# Patient Record
Sex: Male | Born: 1975 | Race: Black or African American | Hispanic: No | Marital: Single | Smoking: Former smoker
Health system: Southern US, Community
[De-identification: ages and names within clinical notes are randomized; demographics above are authoritative.]

## PROBLEM LIST (undated history)

## (undated) DIAGNOSIS — F17200 Nicotine dependence, unspecified, uncomplicated: Secondary | ICD-10-CM

## (undated) DIAGNOSIS — H5440 Blindness, one eye, unspecified eye: Secondary | ICD-10-CM

## (undated) DIAGNOSIS — H409 Unspecified glaucoma: Secondary | ICD-10-CM

## (undated) DIAGNOSIS — R7303 Prediabetes: Secondary | ICD-10-CM

## (undated) DIAGNOSIS — E785 Hyperlipidemia, unspecified: Secondary | ICD-10-CM

## (undated) DIAGNOSIS — E119 Type 2 diabetes mellitus without complications: Secondary | ICD-10-CM

## (undated) HISTORY — PX: SHOULDER ARTHROSCOPY WITH ROTATOR CUFF REPAIR: SHX5685

## (undated) HISTORY — DX: Blindness, one eye, unspecified eye: H54.40

## (undated) HISTORY — DX: Unspecified glaucoma: H40.9

## (undated) HISTORY — DX: Prediabetes: R73.03

## (undated) HISTORY — DX: Hyperlipidemia, unspecified: E78.5

## (undated) HISTORY — PX: EYE SURGERY: SHX253

## (undated) HISTORY — DX: Nicotine dependence, unspecified, uncomplicated: F17.200

---

## 2016-08-07 ENCOUNTER — Emergency Department (HOSPITAL_COMMUNITY)
Admission: EM | Admit: 2016-08-07 | Discharge: 2016-08-07 | Disposition: A | Discharge status: Home / Self Care | Payer: Self-pay | Attending: Emergency Medicine | Admitting: Emergency Medicine

## 2016-08-07 ENCOUNTER — Encounter (HOSPITAL_COMMUNITY): Payer: Self-pay

## 2016-08-07 DIAGNOSIS — F17200 Nicotine dependence, unspecified, uncomplicated: Secondary | ICD-10-CM | POA: Insufficient documentation

## 2016-08-07 DIAGNOSIS — Z794 Long term (current) use of insulin: Secondary | ICD-10-CM | POA: Insufficient documentation

## 2016-08-07 DIAGNOSIS — E1165 Type 2 diabetes mellitus with hyperglycemia: Secondary | ICD-10-CM | POA: Insufficient documentation

## 2016-08-07 DIAGNOSIS — E119 Type 2 diabetes mellitus without complications: Secondary | ICD-10-CM

## 2016-08-07 DIAGNOSIS — R739 Hyperglycemia, unspecified: Secondary | ICD-10-CM

## 2016-08-07 HISTORY — DX: Type 2 diabetes mellitus without complications: E11.9

## 2016-08-07 LAB — URINE MICROSCOPIC-ADD ON

## 2016-08-07 LAB — URINALYSIS, ROUTINE W REFLEX MICROSCOPIC
Bilirubin Urine: NEGATIVE
Glucose, UA: >1000 mg/dL — AB
Hgb urine dipstick: NEGATIVE
Ketones, ur: NEGATIVE mg/dL
LEUKOCYTES UA: NEGATIVE
NITRITE: NEGATIVE
PH: 6 (ref 5.0–8.0)
Protein, ur: NEGATIVE mg/dL
SPECIFIC GRAVITY, URINE: 1.041 — AB (ref 1.005–1.030)

## 2016-08-07 LAB — CBG MONITORING, ED
GLUCOSE-CAPILLARY: 209 mg/dL — AB (ref 65–99)
Glucose-Capillary: 314 mg/dL — ABNORMAL HIGH (ref 65–99)

## 2016-08-07 LAB — COMPREHENSIVE METABOLIC PANEL
ALT: 30 U/L (ref 17–63)
AST: 21 U/L (ref 15–41)
Albumin: 3.5 g/dL (ref 3.5–5.0)
Alkaline Phosphatase: 87 U/L (ref 38–126)
Anion gap: 6 (ref 5–15)
BUN: 9 mg/dL (ref 6–20)
CHLORIDE: 108 mmol/L (ref 101–111)
CO2: 23 mmol/L (ref 22–32)
Calcium: 9.1 mg/dL (ref 8.9–10.3)
Creatinine, Ser: 0.79 mg/dL (ref 0.61–1.24)
Glucose, Bld: 313 mg/dL — ABNORMAL HIGH (ref 65–99)
POTASSIUM: 4.4 mmol/L (ref 3.5–5.1)
SODIUM: 137 mmol/L (ref 135–145)
Total Bilirubin: 0.3 mg/dL (ref 0.3–1.2)
Total Protein: 6.1 g/dL — ABNORMAL LOW (ref 6.5–8.1)

## 2016-08-07 LAB — CBC WITH DIFFERENTIAL/PLATELET
BASOS PCT: 0 %
Basophils Absolute: 0 10*3/uL (ref 0.0–0.1)
EOS ABS: 0.1 10*3/uL (ref 0.0–0.7)
EOS PCT: 2 %
HCT: 38.1 % — ABNORMAL LOW (ref 39.0–52.0)
Hemoglobin: 11.8 g/dL — ABNORMAL LOW (ref 13.0–17.0)
LYMPHS ABS: 2 10*3/uL (ref 0.7–4.0)
Lymphocytes Relative: 33 %
MCH: 22.7 pg — AB (ref 26.0–34.0)
MCHC: 31 g/dL (ref 30.0–36.0)
MCV: 73.4 fL — AB (ref 78.0–100.0)
MONO ABS: 0.5 10*3/uL (ref 0.1–1.0)
Monocytes Relative: 8 %
NEUTROS PCT: 57 %
Neutro Abs: 3.4 10*3/uL (ref 1.7–7.7)
PLATELETS: 251 10*3/uL (ref 150–400)
RBC: 5.19 MIL/uL (ref 4.22–5.81)
RDW: 12.3 % (ref 11.5–15.5)
WBC: 6 10*3/uL (ref 4.0–10.5)

## 2016-08-07 MED ORDER — METFORMIN HCL 500 MG PO TABS: 500.0000 mg | ORAL_TABLET | Freq: Two times a day (BID) | ORAL | 0 refills | Status: DC

## 2016-08-07 MED ORDER — SODIUM CHLORIDE 0.9 % IV BOLUS (SEPSIS)
1000.0000 mL | Freq: Once | INTRAVENOUS | Status: AC
  Administered 2016-08-07: 1000 mL via INTRAVENOUS

## 2016-08-07 NOTE — Discharge Instructions (Signed)
You have been set up with a follow-up appointment with a new primary care doctor on 08/17/1929. Please do not miss his appointment.  You're started on metformin [facility]. Your new primary care doctor will further titrate your medications or start back on insulin once you're able to afford it.  Please return without fail for worsening symptoms, including confusion, intractable vomiting, severe abdominal pain or chest pain, or any other symptoms concerning to you.

## 2016-08-07 NOTE — ED Notes (Signed)
Pt waiting in waiting room for [facility].

## 2016-08-07 NOTE — ED Notes (Signed)
Pt is aware urine has been needed. Urinal still at bedside. Pt is asleep and has not given sample yet.

## 2016-08-07 NOTE — ED Triage Notes (Signed)
Pt has felt weak over the last several weeks and has last 50lbs in the last few months.

## 2016-08-07 NOTE — Progress Notes (Signed)
Discussed medication with EDP, Joni FearsLui- Plans to offer metformin - listed on Most $4 pharmacy cost lists No need for MATCH at this time

## 2016-08-07 NOTE — ED Provider Notes (Signed)
[facility] Provider Note   CSN: [id number] Arrival date & time: 08/07/16  [id number]     History   Chief Complaint Chief Complaint  Patient presents with  . Weakness    HPI Devon Pace is a 40 y.o. male.  HPI 40 year old male who presents with generalized weakness. He reports being diagnosed with type 2 diabetes a year or 2 ago, and was previously on insulin. States that he did not have insurance and was unable to afford the medication, so has not been taking any of his medications over the past 6 months. During this time has had a 50 pound weight loss with reported polydipsia, polyuria, polyphasia. Associating nausea. Had cold-like symptoms 2 weeks ago, but now resolved. Denies any chest pain, difficulty breathing, abdominal pain, nausea or vomiting, diarrhea, or fever. Past Medical History:  Diagnosis Date  . Diabetes mellitus without complication (HCC)     There are no active problems to display for this patient.   History reviewed. No pertinent surgical history.     Home Medications    Prior to Admission medications   Medication Sig Start Date End Date Taking? Authorizing Provider  metFORMIN (GLUCOPHAGE) 500 MG tablet Take 1 tablet (500 mg total) by mouth 2 (two) times daily with a meal. 08/07/16   Lavera Guiseana Duo Aryel Edelen, MD    Family History No family history on file. Reviewed, noncontributory Social History Social History  Substance Use Topics  . Smoking status: Current Every Day Smoker    Packs/day: 0.50  . Smokeless tobacco: Never Used  . Alcohol use No     Allergies   Patient has no known allergies.   Review of Systems Review of Systems 10/14 systems reviewed and are negative other than those stated in the HPI   Physical Exam Updated Vital Signs BP 123/80   Pulse 63   Resp 13   Ht 5\' 7"  (1.702 m)   SpO2 100%   Physical Exam Physical Exam  Nursing note and vitals reviewed. Constitutional: Well developed, well nourished, non-toxic, and in no  acute distress Head: Normocephalic and atraumatic.  Mouth/Throat: Oropharynx is clear and moist.  Neck: Normal range of motion. Neck supple.  Cardiovascular: Normal rate and regular rhythm.   Pulmonary/Chest: Effort normal and breath sounds normal.  Abdominal: Soft. There is no tenderness. There is no rebound and no guarding.  Musculoskeletal: Normal range of motion.  Neurological: Alert, no facial droop, fluent speech, moves all extremities symmetrically Skin: Skin is warm and dry.  Psychiatric: Cooperative   ED Treatments / Results  Labs (all labs ordered are listed, but only abnormal results are displayed) Labs Reviewed  CBC WITH DIFFERENTIAL/PLATELET - Abnormal; Notable for the following:       Result Value   Hemoglobin 11.8 (*)    HCT 38.1 (*)    MCV 73.4 (*)    MCH 22.7 (*)    All other components within normal limits  COMPREHENSIVE METABOLIC PANEL - Abnormal; Notable for the following:    Glucose, Bld 313 (*)    Total Protein 6.1 (*)    All other components within normal limits  URINALYSIS, ROUTINE W REFLEX MICROSCOPIC (NOT AT [facility]) - Abnormal; Notable for the following:    Specific Gravity, Urine 1.041 (*)    Glucose, UA >1000 (*)    All other components within normal limits  URINE MICROSCOPIC-ADD ON - Abnormal; Notable for the following:    Squamous Epithelial / LPF 0-5 (*)    Bacteria, UA RARE (*)  All other components within normal limits  CBG MONITORING, ED - Abnormal; Notable for the following:    Glucose-Capillary 314 (*)    All other components within normal limits  CBG MONITORING, ED - Abnormal; Notable for the following:    Glucose-Capillary 209 (*)    All other components within normal limits    EKG  EKG Interpretation  Date/Time:  Friday August 07 2016 07:03:10 EST Ventricular Rate:  69 PR Interval:    QRS Duration: 93 QT Interval:  413 QTC Calculation: 443 R Axis:   55 Text Interpretation:  Sinus rhythm no prior ekg  Confirmed by Merrianne Mccumbers MD,  Sherryann Frese ([id number]) on 08/07/2016 8:27:33 AM       Radiology No results found.  Procedures Procedures (including critical care time)  Medications Ordered in ED Medications  sodium chloride 0.9 % bolus 1,000 mL (0 mLs Intravenous Stopped 08/07/16 0950)     Initial Impression / Assessment and Plan / ED Course  I have reviewed the triage vital signs and the nursing notes.  Pertinent labs & imaging results that were available during my care of the patient were reviewed by me and considered in my medical decision making (see chart for details).  Clinical Course    Presenting with hyperglycemia in the setting of not having insulin in over 6 months. Nontoxic in no acute distress with stable vital signs. Point-of-care glucose in the 300s, without evidence of DKA, infection, or other complications. He received IV fluids.  Spoke with Selena BattenKim from case management. Set up with new PCP appointment 08/17/2016 to establish care and get restarted on medication. At this time, someone will contact patient to start application process for orange card to afford insulin. Since this takes a few weeks to go through, will discharge him with metformin (on $4 list at [facility]) until he is able to afford insulin.   Strict return and follow-up instructions reviewed. He expressed understanding of all discharge instructions and felt comfortable with the plan of care.   Final Clinical Impressions(s) / ED Diagnoses   Final diagnoses:  Hyperglycemia  Type 2 diabetes mellitus without complication, with long-term current use of insulin (HCC)    New Prescriptions New Prescriptions   METFORMIN (GLUCOPHAGE) 500 MG TABLET    Take 1 tablet (500 mg total) by mouth 2 (two) times daily with a meal.     Lavera Guiseana Duo Traxton Kolenda, MD 08/07/16 1042

## 2016-08-07 NOTE — ED Notes (Signed)
Pt. Verbalized understanding of need to follow up with resources provided by case management. NAD. Ambulatory at discharge. Driven home by significant other.

## 2016-08-07 NOTE — Progress Notes (Signed)
[facility] to provide an update on appt, resources Faxed resources to RN to give to pt  [place] given a f/u appt with L Bernhardt   CM provided written information to assist pt with determining choice for uninsured accepting pcps, discussed the importance of pcp vs EDP services for f/u care, www.needymeds.org, www.goodrx.com, discounted pharmacies and other [place] resources such as [facility] , [facility], affordable care act, financial assistance, uninsured dental services, [place] med assist, DSS and  health department  Reviewed resources for [place] uninsured accepting pcps like [facility], family medicine at [address], [facility], [facility], local urgent care centers, [facility], [facility], general medical clinics, [facility] [place], [facility] urgent care plus others, medication resources, [facility] out patient pharmacies and housing Provided [facility] contact information Cm completed referral to assist with orange card Pt to be contact by [facility] clinical liaison

## 2016-08-07 NOTE — Progress Notes (Signed)
   08/07/16 0000  CM Assessment  Expected Discharge Plan Home/Self Care  In-house Referral NA  Discharge Planning Services CM Consult  [facility] Choice NA  Choice offered to / list presented to  Patient  Status of Service Completed, signed off  Discharge Disposition Home/Self Care   ED CM consulted by Dr Joni FearsLui to request assist with uninsured pcp and medications for pt  He is on insulin and unable to afford ED MD to re evaluate medication route for cost  CM discussed processing of orange card may take awhile and suggest med change for cost reasons [facility] to get [facility] an upcoming [facility]t  Entered in d/c instructions Concepcion LivingBERNHARDT, LINDA, NP  Family Medicine [phone] [facility] [address] [place] [id number]   Next Steps: Go on 08/17/2016  Instructions: You Have been given an appointment to see Bonita QuinLinda bernhardt for follow up medical visit [facility] at 0930 Please go to appointment or call if questions or concerns with this appointment   A referral for you has been sent to [facility] if you have not received a call in 3 days you may contact them Call Scherry RanKaren Andrianos at [phone] Tuesday-Friday [email or web address]     You can also get assist at your upcoming appointment with Concepcion LivingLinda Bernhardt on 08/17/16 at 0930 Please ask staff during the appointment   Next Steps: Follow up on 08/17/2016

## 2016-08-09 NOTE — ED Notes (Signed)
[facility] came to [facility] today.  Needs work note to return to work 08-10-16.

## 2016-08-17 ENCOUNTER — Ambulatory Visit: Payer: Self-pay | Admitting: Family Medicine

## 2016-08-18 ENCOUNTER — Ambulatory Visit (INDEPENDENT_AMBULATORY_CARE_PROVIDER_SITE_OTHER): Payer: Self-pay | Admitting: Family Medicine

## 2016-08-18 ENCOUNTER — Encounter: Payer: Self-pay | Admitting: Family Medicine

## 2016-08-18 VITALS — BP 128/70 | HR 77 | Temp 98.4°F | Resp 16 | Ht 67.0 in | Wt 151.0 lb

## 2016-08-18 DIAGNOSIS — Z23 Encounter for immunization: Secondary | ICD-10-CM

## 2016-08-18 DIAGNOSIS — E119 Type 2 diabetes mellitus without complications: Secondary | ICD-10-CM | POA: Insufficient documentation

## 2016-08-18 DIAGNOSIS — E1139 Type 2 diabetes mellitus with other diabetic ophthalmic complication: Secondary | ICD-10-CM | POA: Insufficient documentation

## 2016-08-18 DIAGNOSIS — R5383 Other fatigue: Secondary | ICD-10-CM

## 2016-08-18 LAB — CBC WITH DIFFERENTIAL/PLATELET
BASOS ABS: 0 {cells}/uL (ref 0–200)
Basophils Relative: 0 %
EOS ABS: 132 {cells}/uL (ref 15–500)
EOS PCT: 2 %
HCT: 39.1 % (ref 38.5–50.0)
HEMOGLOBIN: 11.4 g/dL — AB (ref 13.2–17.1)
LYMPHS ABS: 2772 {cells}/uL (ref 850–3900)
Lymphocytes Relative: 42 %
MCH: 22.4 pg — AB (ref 27.0–33.0)
MCHC: 29.2 g/dL — AB (ref 32.0–36.0)
MCV: 76.7 fL — AB (ref 80.0–100.0)
MONOS PCT: 7 %
MPV: 10.4 fL (ref 7.5–12.5)
Monocytes Absolute: 462 cells/uL (ref 200–950)
NEUTROS PCT: 49 %
Neutro Abs: 3234 cells/uL (ref 1500–7800)
Platelets: 339 10*3/uL (ref 140–400)
RBC: 5.1 MIL/uL (ref 4.20–5.80)
RDW: 13.2 % (ref 11.0–15.0)
WBC: 6.6 10*3/uL (ref 3.8–10.8)

## 2016-08-18 LAB — POCT URINALYSIS DIP (DEVICE)
BILIRUBIN URINE: NEGATIVE
GLUCOSE, UA: NEGATIVE mg/dL
Hgb urine dipstick: NEGATIVE
KETONES UR: NEGATIVE mg/dL
LEUKOCYTES UA: NEGATIVE
NITRITE: NEGATIVE
PH: 6 (ref 5.0–8.0)
Protein, ur: NEGATIVE mg/dL
Specific Gravity, Urine: 1.02 (ref 1.005–1.030)
Urobilinogen, UA: 0.2 mg/dL (ref 0.0–1.0)

## 2016-08-18 LAB — TSH: TSH: 0.31 m[IU]/L — AB (ref 0.40–4.50)

## 2016-08-18 LAB — POCT GLYCOSYLATED HEMOGLOBIN (HGB A1C): HEMOGLOBIN A1C: 8.5

## 2016-08-18 MED ORDER — SITAGLIPTIN PHOSPHATE 50 MG PO TABS: 50.0000 mg | ORAL_TABLET | Freq: Every day | ORAL | 2 refills | Status: DC

## 2016-08-18 NOTE — Progress Notes (Signed)
Subjective:    Patient ID: Devon Pace, male    DOB: October 22, 1975, 40 y.o.   MRN: [id number]  HPI Devon Pace, a 40 year old male with a history of diabetes mellitus type 2 presents to establish care. Devon Pace has not had a primary provider. He was evaluated in the emergency department on 08/07/2016 and prescribed Metformin for type 2 diabetes mellitus. Symptoms: polydipsia. Symptoms have gradually improved. Patient denies foot ulcerations, paresthesia of the feet, polyuria, visual disturbances and vomitting. He reports a 30 pound weight loss over the past 6 months.  Evaluation to date has been included: fasting blood sugar.  Devon Pace does not check blood sugars at home. Past Medical History:  Diagnosis Date  . Diabetes mellitus without complication [facility](HCC)    Social History   Social History  . Marital status: Single    Spouse name: N/A  . Number of children: N/A  . Years of education: N/A   Occupational History  . Not on file.   Social History Main Topics  . Smoking status: Current Every Day Smoker    Packs/day: 0.25  . Smokeless tobacco: Never Used  . Alcohol use Yes     Comment: occ  . Drug use: No  . Sexual activity: Not on file   Other Topics Concern  . Not on file   Social History Narrative  . No narrative on file  No Known Allergies  Immunization History  Administered Date(s) Administered  . Pneumococcal Polysaccharide-23 08/18/2016   Review of Systems  Constitutional: Positive for fatigue.  HENT: Negative.   Eyes: Positive for visual disturbance.  Respiratory: Negative.   Cardiovascular: Negative.  Negative for chest pain, palpitations and leg swelling.  Gastrointestinal: Negative.   Endocrine: Positive for polydipsia. Negative for polyphagia and polyuria.  Genitourinary: Negative.   Musculoskeletal: Negative.   Skin: Negative.   Allergic/Immunologic: Negative.   Neurological: Negative.  Negative for weakness.  Hematological: Negative.    Psychiatric/Behavioral: Negative.        Objective:   Physical Exam  Constitutional: He is oriented to person, place, and time. He appears well-developed and well-nourished.  HENT:  Head: Normocephalic and atraumatic.  Right Ear: External ear normal.  Left Ear: External ear normal.  Nose: Nose normal.  Mouth/Throat: Oropharynx is clear and moist.  Eyes: Conjunctivae and EOM are normal. Pupils are equal, round, and reactive to light.  Neck: Normal range of motion. Neck supple.  Pulmonary/Chest: Effort normal and breath sounds normal.  Abdominal: Soft. Bowel sounds are normal.  Musculoskeletal: Normal range of motion.  Neurological: He is alert and oriented to person, place, and time. He has normal reflexes.  Monofilament negative  Skin: Skin is warm and dry.  Psychiatric: He has a normal mood and affect. His behavior is normal. Judgment and thought content normal.      BP 128/70 (BP Location: Right Arm, Patient Position: Sitting, Cuff Size: Normal)   Pulse 77   Temp 98.4 F (36.9 C) (Oral)   Resp 16   Ht 5\' 7"  (1.702 m)   Wt 151 lb (68.5 kg)   SpO2 100%   BMI 23.65 kg/m  Assessment & Plan:  1. Type 2 diabetes mellitus without complication, without long-term current use of insulin (HCC) Hemoglobin a1C is above goal at 8.5. Will add Januvia to current medication regimen. Will also send a referral for a diabetic eye exam. The patient is asked to make an attempt to improve diet and exercise patterns to aid in  medical management of this problem.Recommend a lowfat, low carbohydrate diet divided over 5-6 small meals, increase water intake to 6-8 glasses, and 150 minutes per week of cardiovascular exercise.   - Lipid Panel - HgB A1c - sitaGLIPtin (JANUVIA) 50 MG tablet; Take 1 tablet (50 mg total) by mouth daily.  Dispense: 30 tablet; Refill: 2 - Ambulatory referral to Ophthalmology  2. Fatigue, unspecified type  - TSH - COMPLETE METABOLIC PANEL WITH GFR - CBC with  Differential  3. Immunization due  - Pneumococcal polysaccharide vaccine 23-valent greater than or equal to 2yo subcutaneous/IM   RTC: Please follow up in 1 month for medication management  Massie MaroonHollis,Yandriel Boening M, FNP

## 2016-08-19 ENCOUNTER — Telehealth: Payer: Self-pay

## 2016-08-19 LAB — COMPLETE METABOLIC PANEL WITH GFR
ALBUMIN: 4.2 g/dL (ref 3.6–5.1)
ALK PHOS: 75 U/L (ref 40–115)
ALT: 17 U/L (ref 9–46)
AST: 15 U/L (ref 10–40)
BILIRUBIN TOTAL: 0.4 mg/dL (ref 0.2–1.2)
BUN: 12 mg/dL (ref 7–25)
CO2: 26 mmol/L (ref 20–31)
Calcium: 9.4 mg/dL (ref 8.6–10.3)
Chloride: 106 mmol/L (ref 98–110)
Creat: 0.93 mg/dL (ref 0.60–1.35)
GFR, Est African American: >89 mL/min (ref >=60)
GFR, Est Non African American: >89 mL/min (ref >=60)
GLUCOSE: 97 mg/dL (ref 65–99)
Potassium: 4.7 mmol/L (ref 3.5–5.3)
SODIUM: 139 mmol/L (ref 135–146)
TOTAL PROTEIN: 6.9 g/dL (ref 6.1–8.1)

## 2016-08-19 LAB — LIPID PANEL
CHOLESTEROL: 130 mg/dL (ref <200)
HDL: 39 mg/dL — ABNORMAL LOW (ref >40)
LDL Cholesterol: 53 mg/dL (ref <100)
Total CHOL/HDL Ratio: 3.3 Ratio (ref <5.0)
Triglycerides: 188 mg/dL — ABNORMAL HIGH (ref <150)
VLDL: 38 mg/dL — ABNORMAL HIGH (ref <30)

## 2016-08-19 NOTE — Telephone Encounter (Signed)
Called and left message, advised of labs and the need to increase iron in diet such as green leafy veggies, beans, and organ meats. I advised that patient could take otc fish oil as directed on bottle. Advised to keep next appointment and to call back if any questions. Thanks!

## 2016-08-19 NOTE — Patient Instructions (Addendum)
Diabetes and Foot Care Diabetes may cause you to have problems because of poor blood supply (circulation) to your feet and legs. This may cause the skin on your feet to become thinner, break easier, and heal more slowly. Your skin may become dry, and the skin may peel and crack. You may also have nerve damage in your legs and feet causing decreased feeling in them. You may not notice minor injuries to your feet that could lead to infections or more serious problems. Taking care of your feet is one of the most important things you can do for yourself. Follow these instructions at home:  Wear shoes at all times, even in the house. Do not go barefoot. Bare feet are easily injured.  Check your feet daily for blisters, cuts, and redness. If you cannot see the bottom of your feet, use a mirror or ask someone for help.  Wash your feet with warm water (do not use hot water) and mild soap. Then pat your feet and the areas between your toes until they are completely dry. Do not soak your feet as this can dry your skin.  Apply a moisturizing lotion or petroleum jelly (that does not contain alcohol and is unscented) to the skin on your feet and to dry, brittle toenails. Do not apply lotion between your toes.  Trim your toenails straight across. Do not dig under them or around the cuticle. File the edges of your nails with an emery board or nail file.  Do not cut corns or calluses or try to remove them with medicine.  Wear clean socks or stockings every day. Make sure they are not too tight. Do not wear knee-high stockings since they may decrease blood flow to your legs.  Wear shoes that fit properly and have enough cushioning. To break in new shoes, wear them for just a few hours a day. This prevents you from injuring your feet. Always look in your shoes before you put them on to be sure there are no objects inside.  Do not cross your legs. This may decrease the blood flow to your feet.  If you find a  minor scrape, cut, or break in the skin on your feet, keep it and the skin around it clean and dry. These areas may be cleansed with mild soap and water. Do not cleanse the area with peroxide, alcohol, or iodine.  When you remove an adhesive bandage, be sure not to damage the skin around it.  If you have a wound, look at it several times a day to make sure it is healing.  Do not use heating pads or hot water bottles. They may burn your skin. If you have lost feeling in your feet or legs, you may not know it is happening until it is too late.  Make sure your health care provider performs a complete foot exam at least annually or more often if you have foot problems. Report any cuts, sores, or bruises to your health care provider immediately. Contact a health care provider if:  You have an injury that is not healing.  You have cuts or breaks in the skin.  You have an ingrown nail.  You notice redness on your legs or feet.  You feel burning or tingling in your legs or feet.  You have pain or cramps in your legs and feet.  Your legs or feet are numb.  Your feet always feel cold. Get help right away if:  There is increasing   redness, swelling, or pain in or around a wound.  There is a red line that goes up your leg.  Pus is coming from a wound.  You develop a fever or as directed by your health care provider.  You notice a bad smell coming from an ulcer or wound. This information is not intended to replace advice given to you by your health care provider. Make sure you discuss any questions you have with your health care provider. Document Released: 08/21/2000 Document Revised: 01/30/2016 Document Reviewed: 01/31/2013 Elsevier Interactive Patient Education  2017 Elsevier Inc.  Diabetes Mellitus and Exercise Exercising regularly is important for your overall health, especially when you have diabetes (diabetes mellitus). Exercising is not only about losing weight. It has many  health benefits, such as increasing muscle strength and bone density and reducing body fat and stress. This leads to improved fitness, flexibility, and endurance, all of which result in better overall health. Exercise has additional benefits for people with diabetes, including:  Reducing appetite.  Helping to lower and control blood glucose.  Lowering blood pressure.  Helping to control amounts of fatty substances (lipids) in the blood, such as cholesterol and triglycerides.  Helping the body to respond better to insulin (improving insulin sensitivity).  Reducing how much insulin the body needs.  Decreasing the risk for heart disease by:  Lowering cholesterol and triglyceride levels.  Increasing the levels of good cholesterol.  Lowering blood glucose levels. What is my activity plan? Your health care provider or certified diabetes educator can help you make a plan for the type and frequency of exercise (activity plan) that works for you. Make sure that you:  Do at least 150 minutes of moderate-intensity or vigorous-intensity exercise each week. This could be brisk walking, biking, or water aerobics.  Do stretching and strength exercises, such as yoga or weightlifting, at least 2 times a week.  Spread out your activity over at least 3 days of the week.  Get some form of physical activity every day.  Do not go more than 2 days in a row without some kind of physical activity.  Avoid being inactive for more than 90 minutes at a time. Take frequent breaks to walk or stretch.  Choose a type of exercise or activity that you enjoy, and set realistic goals.  Start slowly, and gradually increase the intensity of your exercise over time. What do I need to know about managing my diabetes?  Check your blood glucose before and after exercising.  If your blood glucose is higher than 240 mg/dL (13.3 mmol/L) before you exercise, check your urine for ketones. If you have ketones in your  urine, do not exercise until your blood glucose returns to normal.  Know the symptoms of low blood glucose (hypoglycemia) and how to treat it. Your risk for hypoglycemia increases during and after exercise. Common symptoms of hypoglycemia can include:  Hunger.  Anxiety.  Sweating and feeling clammy.  Confusion.  Dizziness or feeling light-headed.  Increased heart rate or palpitations.  Blurry vision.  Tingling or numbness around the mouth, lips, or tongue.  Tremors or shakes.  Irritability.  Keep a rapid-acting carbohydrate snack available before, during, and after exercise to help prevent or treat hypoglycemia.  Avoid injecting insulin into areas of the body that are going to be exercised. For example, avoid injecting insulin into:  The arms, when playing tennis.  The legs, when jogging.  Keep records of your exercise habits. Doing this can help you and your   health care provider adjust your diabetes management plan as needed. Write down:  Food that you eat before and after you exercise.  Blood glucose levels before and after you exercise.  The type and amount of exercise you have done.  When your insulin is expected to peak, if you use insulin. Avoid exercising at times when your insulin is peaking.  When you start a new exercise or activity, work with your health care provider to make sure the activity is safe for you, and to adjust your insulin, medicines, or food intake as needed.  Drink plenty of water while you exercise to prevent dehydration or heat stroke. Drink enough fluid to keep your urine clear or pale yellow. This information is not intended to replace advice given to you by your health care provider. Make sure you discuss any questions you have with your health care provider. Document Released: 11/14/2003 Document Revised: 03/13/2016 Document Reviewed: 02/03/2016 Elsevier Interactive Patient Education  2017 Elsevier Inc.  

## 2016-08-19 NOTE — Telephone Encounter (Signed)
-----   Message from Massie MaroonLachina M Hollis, [place] sent at 08/19/2016 10:20 AM EST ----- Regarding: lab results Please inform patient that triglycerides are mildly elevated and hemoglobin is mildly decreased. Recommend a diet that is high in iron (beans, organ meats, green leafy veggies), and a low fat diet to assist in lowering triglycerides. Recommend over the counter omega 3 fatty acid tablet (fish oil).  Thanks ----- Message ----- From: Loney HeringLaura E Batten, LPN Sent: 16/10/960412/08/2016   2:18 PM To: Massie MaroonLachina M Hollis, FNP

## 2016-09-18 ENCOUNTER — Encounter (INDEPENDENT_AMBULATORY_CARE_PROVIDER_SITE_OTHER): Payer: Managed Care, Other (non HMO)

## 2016-09-18 DIAGNOSIS — E119 Type 2 diabetes mellitus without complications: Secondary | ICD-10-CM

## 2016-09-18 LAB — GLUCOSE, CAPILLARY: GLUCOSE-CAPILLARY: 109 mg/dL — AB (ref 65–99)

## 2016-11-11 ENCOUNTER — Encounter (HOSPITAL_COMMUNITY): Payer: Self-pay | Admitting: Emergency Medicine

## 2016-11-11 ENCOUNTER — Emergency Department (HOSPITAL_COMMUNITY)
Admission: EM | Admit: 2016-11-11 | Discharge: 2016-11-11 | Disposition: A | Discharge status: Home / Self Care | Payer: Managed Care, Other (non HMO) | Attending: Emergency Medicine | Admitting: Emergency Medicine

## 2016-11-11 DIAGNOSIS — F17200 Nicotine dependence, unspecified, uncomplicated: Secondary | ICD-10-CM | POA: Diagnosis not present

## 2016-11-11 DIAGNOSIS — J029 Acute pharyngitis, unspecified: Secondary | ICD-10-CM | POA: Diagnosis present

## 2016-11-11 DIAGNOSIS — E119 Type 2 diabetes mellitus without complications: Secondary | ICD-10-CM | POA: Diagnosis not present

## 2016-11-11 DIAGNOSIS — J069 Acute upper respiratory infection, unspecified: Secondary | ICD-10-CM

## 2016-11-11 DIAGNOSIS — R69 Illness, unspecified: Secondary | ICD-10-CM

## 2016-11-11 DIAGNOSIS — J111 Influenza due to unidentified influenza virus with other respiratory manifestations: Secondary | ICD-10-CM | POA: Insufficient documentation

## 2016-11-11 DIAGNOSIS — Z7984 Long term (current) use of oral hypoglycemic drugs: Secondary | ICD-10-CM | POA: Diagnosis not present

## 2016-11-11 DIAGNOSIS — B9789 Other viral agents as the cause of diseases classified elsewhere: Secondary | ICD-10-CM

## 2016-11-11 LAB — CBG MONITORING, ED: Glucose-Capillary: 89 mg/dL (ref 65–99)

## 2016-11-11 MED ORDER — IBUPROFEN 800 MG PO TABS: 800.0000 mg | ORAL_TABLET | Freq: Four times a day (QID) | ORAL | 0 refills | Status: AC | PRN

## 2016-11-11 MED ORDER — ACETAMINOPHEN 500 MG PO TABS: 500.0000 mg | ORAL_TABLET | Freq: Four times a day (QID) | ORAL | 0 refills | Status: DC | PRN

## 2016-11-11 MED ORDER — ACETAMINOPHEN 500 MG PO TABS
1000.0000 mg | ORAL_TABLET | Freq: Once | ORAL | Status: AC
  Administered 2016-11-11: 1000 mg via ORAL
  Filled 2016-11-11: qty 2

## 2016-11-11 NOTE — ED Provider Notes (Signed)
[facility]-EMERGENCY DEPT Provider Note   CSN: [id number] Arrival date & time: 11/11/16  1441  By signing my name below, I, Freida Busmaniana Omoyeni, attest that this documentation has been prepared under the direction and in the presence of Lyndal Pulleyaniel Joell Buerger, MD . Electronically Signed: Freida Busmaniana Omoyeni, Scribe. 11/11/2016. 3:31 PM.  History   Chief Complaint Chief Complaint  Patient presents with  . URI    The history is provided by the patient. No language interpreter was used.    HPI Comments:  Devon Pace is a 41 y.o. male with a history of DM, who presents to the Emergency Department complaining of a sore throat x 2 days. He reports associated rhinorrhea, body aches, HA, and cough. He also notes an episode of diarrhea. He has been taking dayquil with minimal relief. He denies vomiting and fever. Pt is a current smoker.   Past Medical History:  Diagnosis Date  . Diabetes mellitus without complication [facility](HCC)     Patient Active Problem List   Diagnosis Date Noted  . Type 2 diabetes mellitus (HCC) 08/18/2016    No past surgical history on file.     Home Medications    Prior to Admission medications   Medication Sig Start Date End Date Taking? Authorizing Provider  metFORMIN (GLUCOPHAGE) 500 MG tablet Take 1 tablet (500 mg total) by mouth 2 (two) times daily with a meal. 08/07/16   Lavera Guiseana Duo Liu, MD  sitaGLIPtin (JANUVIA) 50 MG tablet Take 1 tablet (50 mg total) by mouth daily. 08/18/16   Massie MaroonLachina M Hollis, FNP    Family History Family History  Problem Relation Age of Onset  . Diabetes Mother   . Diabetes Maternal Grandmother     Social History Social History  Substance Use Topics  . Smoking status: Current Every Day Smoker    Packs/day: 0.25  . Smokeless tobacco: Never Used  . Alcohol use Yes     Comment: occ     Allergies   Patient has no known allergies.   Review of Systems Review of Systems  Constitutional: Negative for fever.  HENT: Positive for rhinorrhea and sore throat.    Respiratory: Positive for cough. Negative for shortness of breath.   Gastrointestinal: Positive for diarrhea. Negative for vomiting.  Musculoskeletal: Positive for myalgias.  Neurological: Positive for headaches.  All other systems reviewed and are negative.   Physical Exam Updated Vital Signs BP 136/77 (BP Location: Right Arm)   Pulse 70   Temp 98.7 F (37.1 C) (Oral)   Resp 16   Ht 6' (1.829 m)   Wt 151 lb (68.5 kg)   SpO2 100%   BMI 20.48 kg/m   Physical Exam  Constitutional: He is oriented to person, place, and time. He appears well-developed and well-nourished. No distress.  HENT:  Head: Normocephalic and atraumatic.  Nose: Nose normal.  Eyes: Conjunctivae are normal.  Neck: Neck supple. No tracheal deviation present.  Cardiovascular: Normal rate, regular rhythm and normal heart sounds.   Pulmonary/Chest: Effort normal and breath sounds normal. No respiratory distress.  Abdominal: Soft. He exhibits no distension. There is no tenderness.  Neurological: He is alert and oriented to person, place, and time.  Skin: Skin is warm and dry.  Psychiatric: He has a normal mood and affect.  Nursing note and vitals reviewed.    ED Treatments / Results  DIAGNOSTIC STUDIES:  Oxygen Saturation is 100% on RA, normal by my interpretation.    COORDINATION OF CARE:  3:26 PM Discussed treatment plan with  pt at bedside and pt agreed to plan.  Labs (all labs ordered are listed, but only abnormal results are displayed) Labs Reviewed  CBG MONITORING, ED    EKG  EKG Interpretation None       Radiology No results found.  Procedures Procedures (including critical care time)  Medications Ordered in ED Medications  acetaminophen (TYLENOL) tablet 1,000 mg (1,000 mg Oral Given 11/11/16 1614)     Initial Impression / Assessment and Plan / ED Course  I have reviewed the triage vital signs and the nursing notes.  Pertinent labs & imaging results that were available during  my care of the patient were reviewed by me and considered in my medical decision making (see chart for details).     41 y.o. male presents with viral URI Sx for 2 days. No signs of respiratory distress, non-toxic appearing, CTAB, no concern for pneumonia with this clinical picture. No emergent testing indicated at this time. Pt discharged with likely viral cough which will be self limited in its course. Advised on optimal use of motrin and tylenol for fever or symptomatic control. Plan to follow up with PCP as needed and return precautions discussed for worsening or new concerning symptoms.   Final Clinical Impressions(s) / ED Diagnoses   Final diagnoses:  Viral URI with cough  Influenza-like illness    New Prescriptions New Prescriptions   ACETAMINOPHEN (TYLENOL) 500 MG TABLET    Take 1 tablet (500 mg total) by mouth every 6 (six) hours as needed for mild pain, moderate pain or fever.   IBUPROFEN (ADVIL,MOTRIN) 800 MG TABLET    Take 1 tablet (800 mg total) by mouth every 6 (six) hours as needed for fever, mild pain or moderate pain.   I personally performed the services described in this documentation, which was scribed in my presence. The recorded information has been reviewed and is accurate.      Lyndal Pulley, MD 11/11/16 [phone]

## 2016-11-11 NOTE — ED Triage Notes (Signed)
Pt reports cough, nasal congestion and sore throat x 2 days.  

## 2016-11-16 ENCOUNTER — Encounter: Payer: Self-pay | Admitting: Family Medicine

## 2016-11-16 ENCOUNTER — Other Ambulatory Visit: Payer: Self-pay | Admitting: Family Medicine

## 2016-11-16 ENCOUNTER — Ambulatory Visit (INDEPENDENT_AMBULATORY_CARE_PROVIDER_SITE_OTHER): Payer: Managed Care, Other (non HMO) | Admitting: Family Medicine

## 2016-11-16 VITALS — BP 125/75 | HR 66 | Temp 98.3°F | Resp 14 | Ht 67.0 in | Wt 159.0 lb

## 2016-11-16 DIAGNOSIS — Z23 Encounter for immunization: Secondary | ICD-10-CM | POA: Diagnosis not present

## 2016-11-16 DIAGNOSIS — E119 Type 2 diabetes mellitus without complications: Secondary | ICD-10-CM

## 2016-11-16 DIAGNOSIS — F17200 Nicotine dependence, unspecified, uncomplicated: Secondary | ICD-10-CM | POA: Diagnosis not present

## 2016-11-16 DIAGNOSIS — Z114 Encounter for screening for human immunodeficiency virus [HIV]: Secondary | ICD-10-CM | POA: Diagnosis not present

## 2016-11-16 DIAGNOSIS — R7989 Other specified abnormal findings of blood chemistry: Secondary | ICD-10-CM

## 2016-11-16 DIAGNOSIS — R946 Abnormal results of thyroid function studies: Secondary | ICD-10-CM

## 2016-11-16 LAB — TSH: TSH: 0.31 m[IU]/L — AB (ref 0.40–4.50)

## 2016-11-16 LAB — BASIC METABOLIC PANEL
BUN: 18 mg/dL (ref 7–25)
CO2: 28 mmol/L (ref 20–31)
Calcium: 9.3 mg/dL (ref 8.6–10.3)
Chloride: 105 mmol/L (ref 98–110)
Creat: 0.93 mg/dL (ref 0.60–1.35)
Glucose, Bld: 86 mg/dL (ref 65–99)
POTASSIUM: 5.1 mmol/L (ref 3.5–5.3)
SODIUM: 139 mmol/L (ref 135–146)

## 2016-11-16 LAB — POCT GLYCOSYLATED HEMOGLOBIN (HGB A1C): HEMOGLOBIN A1C: 5.6

## 2016-11-16 MED ORDER — METFORMIN HCL 500 MG PO TABS: 500.0000 mg | ORAL_TABLET | Freq: Two times a day (BID) | ORAL | 5 refills | Status: DC

## 2016-11-16 NOTE — Progress Notes (Signed)
Subjective:    Patient ID: Devon Pace, male    DOB: 03-Aug-1976, 41 y.o.   MRN: [id number]  HPI Devon Pace, a 41 year old male with a history of diabetes mellitus type 2 presents for 3 month follow up. Patient says that he has been out of antidiabetic medications for greater than 1 month. Devon Pace has not had a primary provider.  Symptoms: polydipsia. Symptoms have gradually improved. Patient denies foot ulcerations, paresthesia of the feet, polyuria, visual disturbances and vomitting.   Evaluation to date has been included: fasting blood sugar.  Devon Pace does not check blood sugars at home. Past Medical History:  Diagnosis Date  . Diabetes mellitus without complication [facility])    Social History   Social History  . Marital status: Single    Spouse name: N/A  . Number of children: N/A  . Years of education: N/A   Occupational History  . Not on file.   Social History Main Topics  . Smoking status: Current Every Day Smoker    Packs/day: 0.25  . Smokeless tobacco: Never Used  . Alcohol use Yes     Comment: occ  . Drug use: No  . Sexual activity: Not on file   Other Topics Concern  . Not on file   Social History Narrative  . No narrative on file  No Known Allergies  Immunization History  Administered Date(s) Administered  . Pneumococcal Polysaccharide-23 08/18/2016   Review of Systems  HENT: Negative.   Eyes: Negative.   Respiratory: Negative.   Cardiovascular: Negative.  Negative for chest pain, palpitations and leg swelling.  Gastrointestinal: Negative.   Endocrine: Negative.  Negative for polydipsia, polyphagia and polyuria.  Genitourinary: Negative.   Musculoskeletal: Negative.   Skin: Negative.   Allergic/Immunologic: Negative.   Neurological: Negative.  Negative for weakness.  Hematological: Negative.   Psychiatric/Behavioral: Negative.        Objective:   Physical Exam  Constitutional: He is oriented to person, place, and time. He appears  well-developed and well-nourished.  HENT:  Head: Normocephalic and atraumatic.  Right Ear: External ear normal.  Left Ear: External ear normal.  Nose: Nose normal.  Mouth/Throat: Oropharynx is clear and moist.  Eyes: Conjunctivae and EOM are normal. Pupils are equal, round, and reactive to light.  Neck: Normal range of motion. Neck supple.  Pulmonary/Chest: Effort normal and breath sounds normal.  Abdominal: Soft. Bowel sounds are normal.  Musculoskeletal: Normal range of motion.  Neurological: He is alert and oriented to person, place, and time. He has normal reflexes.  Monofilament negative  Skin: Skin is warm and dry.  Psychiatric: He has a normal mood and affect. His behavior is normal. Judgment and thought content normal.      BP 125/75 (BP Location: Left Arm, Patient Position: Sitting, Cuff Size: Normal)   Pulse 66   Temp 98.3 F (36.8 C) (Oral)   Resp 14   Ht 5\' 7"  (1.702 m)   Wt 159 lb (72.1 kg)   SpO2 100%   BMI 24.90 kg/m  Assessment & Plan:  1. Type 2 diabetes mellitus without complication, without long-term current use of insulin (HCC) Hemoglobin a1C has decreased from 8.5 to 5.6, will discontinue Januvia.  Will also send a referral for a diabetic eye exam. The patient is asked to make an attempt to improve diet and exercise patterns to aid in medical management of this problem.Recommend a lowfat, low carbohydrate diet divided over 5-6 small meals, increase water intake to  6-8 - HgB A1c - Basic Metabolic Panel - Microalbumin/Creatinine Ratio, Urine - metFORMIN (GLUCOPHAGE) 500 MG tablet; Take 1 tablet (500 mg total) by mouth 2 (two) times daily with a meal.  Dispense: 60 tablet; Refill: 5 - Ambulatory referral to Ophthalmology  2. Abnormal TSH - TSH  3. Screening for HIV (human immunodeficiency virus)  - HIV antibody (with reflex)  4. Need for Tdap vaccination  - Tdap vaccine greater than or equal to 7yo IM  5. Tobacco dependence Smoking cessation  instruction/counseling given:  counseled patient on the dangers of tobacco use, advised patient to stop smoking, and reviewed strategies to maximize success     RTC: 6 months for DMII

## 2016-11-17 LAB — POCT URINALYSIS DIP (DEVICE)
Bilirubin Urine: NEGATIVE
Glucose, UA: NEGATIVE mg/dL
Hgb urine dipstick: NEGATIVE
Ketones, ur: NEGATIVE mg/dL
Leukocytes, UA: NEGATIVE
Nitrite: NEGATIVE
PH: 7 (ref 5.0–8.0)
PROTEIN: NEGATIVE mg/dL
Specific Gravity, Urine: 1.02 (ref 1.005–1.030)
Urobilinogen, UA: 0.2 mg/dL (ref 0.0–1.0)

## 2016-11-17 LAB — MICROALBUMIN / CREATININE URINE RATIO
CREATININE, URINE: 87 mg/dL (ref 20–370)
MICROALB UR: 0.2 mg/dL
MICROALB/CREAT RATIO: 2 ug/mg{creat} (ref <30)

## 2016-11-17 LAB — T3, FREE: T3, Free: 3.1 pg/mL (ref 2.3–4.2)

## 2016-11-17 LAB — HIV ANTIBODY (ROUTINE TESTING W REFLEX): HIV: NONREACTIVE

## 2016-11-17 LAB — T4: T4 TOTAL: 6.8 ug/dL (ref 4.5–12.0)

## 2016-11-18 ENCOUNTER — Other Ambulatory Visit: Payer: Self-pay | Admitting: Family Medicine

## 2016-11-18 DIAGNOSIS — R7989 Other specified abnormal findings of blood chemistry: Secondary | ICD-10-CM

## 2016-11-18 DIAGNOSIS — E0590 Thyrotoxicosis, unspecified without thyrotoxic crisis or storm: Secondary | ICD-10-CM

## 2016-11-18 MED ORDER — METHIMAZOLE 5 MG PO TABS: 5.0000 mg | ORAL_TABLET | Freq: Every day | ORAL | 1 refills | Status: DC

## 2016-11-18 NOTE — Progress Notes (Signed)
TSH continues to be abnormal, consistent with hyperthyroidism. Thyroid hormone helps control the rate at which the body uses energy for all processes within the body. Having decreased TSH can cause symptoms such as increased heart rate, anxiety, diarrhea, changes to hair and skin etc. Will start Methimazole daily for 1 month to regulate TSH. Will recheck TSH in 1 month please scheduled a lab appointment.   Meds ordered this encounter  Medications  . methimazole (TAPAZOLE) 5 MG tablet    Sig: Take 1 tablet (5 mg total) by mouth daily.    Dispense:  30 tablet    Refill:  1    Margrett Kalb M, FNP

## 2016-11-18 NOTE — Progress Notes (Signed)
Called and spoke with patient, advised of Hyperthyroidism and to start metimazole 5mg  daily as directed. Explained to him about hyperthyroidism and the symptoms. Advised to come back in 1 month for recheck on tsh level. This appointment was scheduled for 12/25/2016 @9 :00am. Patient verbalized understanding and had no other questions at this time. Thanks!

## 2016-12-02 ENCOUNTER — Ambulatory Visit (INDEPENDENT_AMBULATORY_CARE_PROVIDER_SITE_OTHER): Payer: Managed Care, Other (non HMO) | Admitting: Family Medicine

## 2016-12-02 ENCOUNTER — Encounter: Payer: Self-pay | Admitting: Family Medicine

## 2016-12-02 VITALS — BP 130/54 | HR 64 | Temp 98.6°F | Ht 67.0 in | Wt 158.0 lb

## 2016-12-02 DIAGNOSIS — R51 Headache: Secondary | ICD-10-CM | POA: Diagnosis not present

## 2016-12-02 DIAGNOSIS — R519 Headache, unspecified: Secondary | ICD-10-CM

## 2016-12-02 DIAGNOSIS — E119 Type 2 diabetes mellitus without complications: Secondary | ICD-10-CM

## 2016-12-02 DIAGNOSIS — R42 Dizziness and giddiness: Secondary | ICD-10-CM | POA: Diagnosis not present

## 2016-12-02 DIAGNOSIS — H538 Other visual disturbances: Secondary | ICD-10-CM

## 2016-12-02 DIAGNOSIS — E0590 Thyrotoxicosis, unspecified without thyrotoxic crisis or storm: Secondary | ICD-10-CM

## 2016-12-02 LAB — GLUCOSE, CAPILLARY: GLUCOSE-CAPILLARY: 75 mg/dL (ref 65–99)

## 2016-12-02 LAB — TSH: TSH: 0.59 mIU/L (ref 0.40–4.50)

## 2016-12-02 NOTE — Progress Notes (Signed)
Subjective:    Patient ID: Devon Pace, male    DOB: March 19, 1976, 41 y.o.   MRN: [id number]  Headache   This is a new problem. The current episode started more than 1 year ago. The problem has been unchanged. The pain is located in the bilateral region. The pain radiates to the face. The pain quality is not similar to prior headaches. The quality of the pain is described as boring and shooting. The pain is at a severity of 7/10. The pain is moderate. Associated symptoms include blurred vision, dizziness and weakness. Pertinent negatives include no abdominal pain, anorexia, drainage, ear pain, eye pain, fever, muscle aches, nausea, neck pain, photophobia, scalp tenderness or swollen glands. There is no history of cancer, cluster headaches, hypertension, immunosuppression, migraine headaches, migraines in the family, obesity, pseudotumor cerebri, recent head traumas, sinus disease or TMJ.   Past Medical History:  Diagnosis Date  . Diabetes mellitus without complication [facility](HCC)    Social History   Social History  . Marital status: Single    Spouse name: N/A  . Number of children: N/A  . Years of education: N/A   Occupational History  . Not on file.   Social History Main Topics  . Smoking status: Current Every Day Smoker    Packs/day: 0.25  . Smokeless tobacco: Never Used  . Alcohol use Yes     Comment: occ  . Drug use: No  . Sexual activity: Not on file   Other Topics Concern  . Not on file   Social History Narrative  . No narrative on file   Immunization History  Administered Date(s) Administered  . Pneumococcal Polysaccharide-23 08/18/2016  . Tdap 11/16/2016   Review of Systems  Constitutional: Negative for fatigue and fever.  HENT: Negative.  Negative for ear pain.   Eyes: Positive for blurred vision. Negative for photophobia, pain and visual disturbance.  Respiratory: Negative.   Cardiovascular: Negative.   Gastrointestinal: Negative for abdominal pain, anorexia and  nausea.  Endocrine: Negative.   Genitourinary: Negative.   Musculoskeletal: Negative for neck pain.  Allergic/Immunologic: Negative.  Negative for immunocompromised state.  Neurological: Positive for dizziness, weakness and headaches.  Hematological: Negative.   Psychiatric/Behavioral: Negative.        Objective:   Physical Exam  Constitutional: He is oriented to person, place, and time. He appears well-developed and well-nourished.  HENT:  Head: Normocephalic and atraumatic.  Right Ear: External ear normal.  Left Ear: External ear normal.  Nose: Nose normal.  Mouth/Throat: Oropharynx is clear and moist.  Eyes: Conjunctivae and EOM are normal. Pupils are equal, round, and reactive to light.  Neck: Normal range of motion. Neck supple.  Cardiovascular: Normal rate, regular rhythm, normal heart sounds and intact distal pulses.   Pulmonary/Chest: Effort normal and breath sounds normal.  Abdominal: Soft. Bowel sounds are normal.  Neurological: He is alert and oriented to person, place, and time. He has normal reflexes. No cranial nerve deficit or sensory deficit. He exhibits normal muscle tone. He displays no seizure activity. Coordination and gait normal.  Skin: Skin is warm and dry.  Psychiatric: He has a normal mood and affect. His behavior is normal. Judgment and thought content normal.      BP (!) 130/54 (BP Location: Right Arm, Patient Position: Sitting, Cuff Size: Small)   Pulse 64   Temp 98.6 F (37 C) (Oral)   Ht 5\' 7"  (1.702 m)   Wt 158 lb (71.7 kg)   SpO2 100%  BMI 24.75 kg/m  Assessment & Plan:  1. Nonintractable headache, unspecified chronicity pattern, unspecified headache type - CT Head Wo Contrast; Future  2. Dizziness, nonspecific - Glucose, capillary - CT Head Wo Contrast; Future  3. Hyperthyroidism Will discontinue methiamazole. Will send a referral to endocrinology.  - TSH - Ambulatory referral to Endocrinology  4. Type 2 diabetes mellitus without  complication, without long-term current use of insulin (HCC) - Glucose (CBG)  5. Blurred vision, bilateral - Glucose, capillary - CT Head Wo Contrast; Future   Nolon Nations  MSN, FNP-C [facility] [address] [place], [place] [address] [phone]   The patient was given clear instructions to go to ER or return to medical center if symptoms do not improve, worsen or new problems develop. The patient verbalized understanding. Will notify patient with laboratory results.

## 2016-12-02 NOTE — Patient Instructions (Addendum)
Will schedule a stat Head CT at [facility] for Worsening headache and dizziness Will send a stat TSH  Will hold Methimazole Will send a referral to endocrinology for abnormal TSH Will follow up by phone with laboratory results and CT resultsDizziness Dizziness is a common problem. It makes you feel unsteady or light-headed. You may feel like you are about to pass out (faint). Dizziness can lead to getting hurt if you stumble or fall. Dizziness can be caused by many things, including:  Medicines.  Not having enough water in your body (dehydration).  Illness. Follow these instructions at home: Eating and drinking   Drink enough fluid to keep your pee (urine) clear or pale yellow. This helps to keep you from getting dehydrated. Try to drink more clear fluids, such as water.  Do not drink alcohol.  Limit how much caffeine you drink or eat, if your doctor tells you to do that.  Limit how much salt (sodium) you drink or eat, if your doctor tells you to do that. Activity   Avoid making quick movements.  When you stand up from sitting in a chair, steady yourself until you feel okay.  In the morning, first sit up on the side of the bed. When you feel okay, stand slowly while you hold onto something. Do this until you know that your balance is fine.  If you need to stand in one place for a long time, move your legs often. Tighten and relax the muscles in your legs while you are standing.  Do not drive or use heavy machinery if you feel dizzy.  Avoid bending down if you feel dizzy. Place items in your home so you can reach them easily without leaning over. Lifestyle   Do not use any products that contain nicotine or tobacco, such as cigarettes and e-cigarettes. If you need help quitting, ask your doctor.  Try to lower your stress level. You can do this by using methods such as yoga or meditation. Talk with your doctor if you need help. General instructions   Watch your dizziness for any  changes.  Take over-the-counter and prescription medicines only as told by your doctor. Talk with your doctor if you think that you are dizzy because of a medicine that you are taking.  Tell a friend or a family member that you are feeling dizzy. If he or she notices any changes in your behavior, have this person call your doctor.  Keep all follow-up visits as told by your doctor. This is important. Contact a doctor if:  Your dizziness does not go away.  Your dizziness or light-headedness gets worse.  You feel sick to your stomach (nauseous).  You have trouble hearing.  You have new symptoms.  You are unsteady on your feet.  You feel like the room is spinning. Get help right away if:  You throw up (vomit) or have watery poop (diarrhea), and you cannot eat or drink anything.  You have trouble:  Talking.  Walking.  Swallowing.  Using your arms, hands, or legs.  You feel generally weak.  You are not thinking clearly, or you have trouble forming sentences. A friend or family member may notice this.  You have:  Chest pain.  Pain in your belly (abdomen).  Shortness of breath.  Sweating.  Your vision changes.  You are bleeding.  You have a very bad headache.  You have neck pain or a stiff neck.  You have a fever. These symptoms may be an  emergency. Do not wait to see if the symptoms will go away. Get medical help right away. Call your local emergency services (911 in the [place] Do not drive yourself to the hospital. Summary  Dizziness makes you feel unsteady or light-headed. You may feel like you are about to pass out (faint).  Drink enough fluid to keep your pee (urine) clear or pale yellow. Do not drink alcohol.  Avoid making quick movements if you feel dizzy.  Watch your dizziness for any changes. This information is not intended to replace advice given to you by your health care provider. Make sure you discuss any questions you have with your health  care provider. Document Released: 08/13/2011 Document Revised: 09/10/2016 Document Reviewed: 09/10/2016 Elsevier Interactive Patient Education  2017 [employer] Blurred Vision Having blurred vision means that you cannot see things clearly. Your vision may seem fuzzy or out of focus. Blurred vision is a very common symptom of an eye or vision problem. Blurred vision is often a gradual blur that occurs in one eye or both eyes. There are many causes of blurred vision, including cataracts, macular degeneration, and diabetic retinopathy. Blurred vision can be diagnosed based on your symptoms and a physical exam. Tell your health care provider about any other health problems you have, any recent eye injury, and any prior surgeries. You may need to see a health care provider who specializes in eye problems (ophthalmologist). Your treatment depends on what is causing your blurred vision.  HOME CARE INSTRUCTIONS  Tell your health care provider about any changes in your blurred vision.  Do not drive or operate heavy machinery if your vision is blurry.  Keep all follow-up visits as directed by your health care provider. This is important. SEEK MEDICAL CARE IF:  Your symptoms get worse.  You have new symptoms.  You have trouble seeing at night.  You have trouble seeing up close or far away.  You have trouble noticing the difference between colors. SEEK IMMEDIATE MEDICAL CARE IF:  You have severe eye pain.  You have a severe headache.  You have flashing lights in your field of vision.  You have a sudden change in vision.  You have a sudden loss of vision.  You have vision change after an injury.  You notice drainage coming from your eyes.  You notice a rash around your eyes. This information is not intended to replace advice given to you by your health care provider. Make sure you discuss any questions you have with your health care provider. Document Released: 08/27/2003 Document  Revised: 01/08/2015 Document Reviewed: 07/18/2014 Elsevier Interactive Patient Education  2017 [employer]

## 2016-12-03 ENCOUNTER — Ambulatory Visit (HOSPITAL_COMMUNITY)
Admission: RE | Admit: 2016-12-03 | Discharge: 2016-12-03 | Disposition: A | Discharge status: Home / Self Care | Payer: Managed Care, Other (non HMO) | Source: Ambulatory Visit | Attending: Family Medicine | Admitting: Family Medicine

## 2016-12-03 DIAGNOSIS — H538 Other visual disturbances: Secondary | ICD-10-CM | POA: Diagnosis not present

## 2016-12-03 DIAGNOSIS — R51 Headache: Secondary | ICD-10-CM | POA: Diagnosis not present

## 2016-12-03 DIAGNOSIS — R519 Headache, unspecified: Secondary | ICD-10-CM

## 2016-12-03 DIAGNOSIS — R42 Dizziness and giddiness: Secondary | ICD-10-CM | POA: Diagnosis not present

## 2016-12-25 ENCOUNTER — Other Ambulatory Visit (INDEPENDENT_AMBULATORY_CARE_PROVIDER_SITE_OTHER): Payer: Managed Care, Other (non HMO)

## 2016-12-25 DIAGNOSIS — R946 Abnormal results of thyroid function studies: Secondary | ICD-10-CM

## 2016-12-25 DIAGNOSIS — E0590 Thyrotoxicosis, unspecified without thyrotoxic crisis or storm: Secondary | ICD-10-CM

## 2016-12-25 DIAGNOSIS — R7989 Other specified abnormal findings of blood chemistry: Secondary | ICD-10-CM

## 2016-12-26 LAB — TSH: TSH: 0.29 m[IU]/L — AB (ref 0.40–4.50)

## 2016-12-28 ENCOUNTER — Other Ambulatory Visit: Payer: Self-pay | Admitting: Family Medicine

## 2016-12-28 NOTE — Progress Notes (Signed)
[name] a  41 year old male with a history of type 2 diabetes mellitus had a TSH of 0.29. T4 is within normal limits. Patient continues to have periodic fatigue, but denies chest pains, heart palpitations, changes to hair/skin, weight loss, anxiety, or diaphoresis. Patient is not followed by endocrinology. Will continue to monitor closely.   Nolon Nations  MSN, FNP-C [facility] [address] [place], [place] [address] [phone]

## 2017-01-26 ENCOUNTER — Encounter: Payer: Self-pay | Admitting: Endocrinology

## 2017-02-11 ENCOUNTER — Emergency Department (HOSPITAL_COMMUNITY)
Admission: EM | Admit: 2017-02-11 | Discharge: 2017-02-11 | Disposition: A | Discharge status: Home / Self Care | Payer: Managed Care, Other (non HMO) | Attending: Emergency Medicine | Admitting: Emergency Medicine

## 2017-02-11 ENCOUNTER — Encounter (HOSPITAL_COMMUNITY): Payer: Self-pay | Admitting: Emergency Medicine

## 2017-02-11 DIAGNOSIS — Z7984 Long term (current) use of oral hypoglycemic drugs: Secondary | ICD-10-CM | POA: Insufficient documentation

## 2017-02-11 DIAGNOSIS — F17210 Nicotine dependence, cigarettes, uncomplicated: Secondary | ICD-10-CM | POA: Insufficient documentation

## 2017-02-11 DIAGNOSIS — G4489 Other headache syndrome: Secondary | ICD-10-CM | POA: Insufficient documentation

## 2017-02-11 DIAGNOSIS — R51 Headache: Secondary | ICD-10-CM | POA: Diagnosis present

## 2017-02-11 DIAGNOSIS — E119 Type 2 diabetes mellitus without complications: Secondary | ICD-10-CM | POA: Insufficient documentation

## 2017-02-11 DIAGNOSIS — R519 Headache, unspecified: Secondary | ICD-10-CM

## 2017-02-11 LAB — BASIC METABOLIC PANEL
ANION GAP: 5 (ref 5–15)
BUN: 13 mg/dL (ref 6–20)
CALCIUM: 8.7 mg/dL — AB (ref 8.9–10.3)
CHLORIDE: 107 mmol/L (ref 101–111)
CO2: 25 mmol/L (ref 22–32)
Creatinine, Ser: 0.78 mg/dL (ref 0.61–1.24)
GFR calc non Af Amer: >60 mL/min (ref >60)
Glucose, Bld: 113 mg/dL — ABNORMAL HIGH (ref 65–99)
POTASSIUM: 5.2 mmol/L — AB (ref 3.5–5.1)
Sodium: 137 mmol/L (ref 135–145)

## 2017-02-11 LAB — CBC WITH DIFFERENTIAL/PLATELET
BASOS PCT: 0 %
Basophils Absolute: 0 10*3/uL (ref 0.0–0.1)
Eosinophils Absolute: 0.1 10*3/uL (ref 0.0–0.7)
Eosinophils Relative: 2 %
HEMATOCRIT: 41 % (ref 39.0–52.0)
HEMOGLOBIN: 12.5 g/dL — AB (ref 13.0–17.0)
LYMPHS ABS: 2.3 10*3/uL (ref 0.7–4.0)
LYMPHS PCT: 45 %
MCH: 23 pg — ABNORMAL LOW (ref 26.0–34.0)
MCHC: 30.5 g/dL (ref 30.0–36.0)
MCV: 75.4 fL — ABNORMAL LOW (ref 78.0–100.0)
Monocytes Absolute: 0.4 10*3/uL (ref 0.1–1.0)
Monocytes Relative: 9 %
NEUTROS ABS: 2.2 10*3/uL (ref 1.7–7.7)
NEUTROS PCT: 44 %
Platelets: 247 10*3/uL (ref 150–400)
RBC: 5.44 MIL/uL (ref 4.22–5.81)
RDW: 12.5 % (ref 11.5–15.5)
WBC: 4.9 10*3/uL (ref 4.0–10.5)

## 2017-02-11 MED ORDER — LORATADINE-PSEUDOEPHEDRINE ER 10-240 MG PO TB24: 1.0000 | ORAL_TABLET | Freq: Every day | ORAL | 0 refills | Status: DC

## 2017-02-11 MED ORDER — IBUPROFEN 600 MG PO TABS: 600.0000 mg | ORAL_TABLET | Freq: Four times a day (QID) | ORAL | 0 refills | Status: DC | PRN

## 2017-02-11 NOTE — ED Provider Notes (Signed)
[facility] DEPT Provider Note   CSN: [id number] Arrival date & time: 02/11/17  0725     History   Chief Complaint Chief Complaint  Patient presents with  . Headache    HPI Devon Pace is a 41 y.o. male.  HPI Patient reports she's had a headache for several days. It's at the top of his head and forehead. Aching and pressure. It comes and goes. This is been going on for several months. Patient reports sometimes he feels he has a little bit of blurred vision. Occasional nausea no vomiting. No sore throat or dental pain. No significant nasal congestion or drainage. No other associated symptoms. No fevers no chills no neck stiffness no joint redness or swelling. Patient is diabetic. He reports he takes metformin. He reports his blood sugar was 95 today. Past Medical History:  Diagnosis Date  . Diabetes mellitus without complication [facility])     Patient Active Problem List   Diagnosis Date Noted  . Tobacco dependence 11/16/2016  . Abnormal TSH 11/16/2016  . Type 2 diabetes mellitus (HCC) 08/18/2016    History reviewed. No pertinent surgical history.     Home Medications    Prior to Admission medications   Medication Sig Start Date End Date Taking? Authorizing Provider  acetaminophen (TYLENOL) 500 MG tablet Take 1 tablet (500 mg total) by mouth every 6 (six) hours as needed for mild pain, moderate pain or fever. 11/11/16   Lyndal Pulley, MD  ibuprofen (ADVIL,MOTRIN) 600 MG tablet Take 1 tablet (600 mg total) by mouth every 6 (six) hours as needed. 02/11/17   Arby Barrette, MD  loratadine-pseudoephedrine (CLARITIN-D 24 HOUR) 10-240 MG 24 hr tablet Take 1 tablet by mouth daily. 02/11/17   Arby Barrette, MD  metFORMIN (GLUCOPHAGE) 500 MG tablet Take 1 tablet (500 mg total) by mouth 2 (two) times daily with a meal. 11/16/16   Massie Maroon, FNP  sitaGLIPtin (JANUVIA) 50 MG tablet Take 1 tablet (50 mg total) by mouth daily. Patient not taking: Reported on 12/02/2016 08/18/16    Massie Maroon, FNP    Family History Family History  Problem Relation Age of Onset  . Diabetes Mother   . Diabetes Maternal Grandmother     Social History Social History  Substance Use Topics  . Smoking status: Current Every Day Smoker    Packs/day: 0.25  . Smokeless tobacco: Never Used  . Alcohol use Yes     Comment: occ     Allergies   Patient has no known allergies.   Review of Systems Review of Systems 10 Systems reviewed and are negative for acute change except as noted in the HPI.   Physical Exam Updated Vital Signs BP 125/87   Pulse (!) 58   Temp 97.5 F (36.4 C)   Resp 16   SpO2 100%   Physical Exam  Constitutional: He is oriented to person, place, and time. He appears well-developed and well-nourished.  HENT:  Head: Normocephalic and atraumatic.  Bilateral TMs normal. Slight bogginess of nasal mucosa. No percussion tenderness over the sinuses. Dentition in good condition. No facial swelling. Posterior oropharynx widely pain.  Eyes: Conjunctivae and EOM are normal. Pupils are equal, round, and reactive to light.  Neck: Neck supple.  No meningismus.  Cardiovascular: Normal rate and regular rhythm.   No murmur heard. Pulmonary/Chest: Effort normal and breath sounds normal. No respiratory distress.  Abdominal: Soft. There is no tenderness.  Musculoskeletal: Normal range of motion. He exhibits no edema.  Lymphadenopathy:  He has no cervical adenopathy.  Neurological: He is alert and oriented to person, place, and time. No cranial nerve deficit. He exhibits normal muscle tone. Coordination normal.  Normal heel shin examination. Motor strength intact 4. Normal coordinated movements without difficulty.  Skin: Skin is warm and dry.  Psychiatric: He has a normal mood and affect.  Nursing note and vitals reviewed.    ED Treatments / Results  Labs (all labs ordered are listed, but only abnormal results are displayed) Labs Reviewed  BASIC METABOLIC  PANEL - Abnormal; Notable for the following:       Result Value   Potassium 5.2 (*)    Glucose, Bld 113 (*)    Calcium 8.7 (*)    All other components within normal limits  CBC WITH DIFFERENTIAL/PLATELET - Abnormal; Notable for the following:    Hemoglobin 12.5 (*)    MCV 75.4 (*)    MCH 23.0 (*)    All other components within normal limits    EKG  EKG Interpretation None       Radiology No results found.  Procedures Procedures (including critical care time)  Medications Ordered in ED Medications - No data to display   Initial Impression / Assessment and Plan / ED Course  I have reviewed the triage vital signs and the nursing notes.  Pertinent labs & imaging results that were available during my care of the patient were reviewed by me and considered in my medical decision making (see chart for details).     Final Clinical Impressions(s) / ED Diagnoses   Final diagnoses:  Acute nonintractable headache, unspecified headache type   Patient has had a headache has been waxing and waning. He does not have acute onset, fever, neurologic symptoms or neck stiffness. Low suspicion for emergent etiology of headaches. With quality of headache and minimal associated symptoms I more suspect seasonal allergy\sinus pressure or tension headache. Plan will be for the patient to try a course of Claritin-D with ibuprofen and follow-up with PCP. New Prescriptions New Prescriptions   IBUPROFEN (ADVIL,MOTRIN) 600 MG TABLET    Take 1 tablet (600 mg total) by mouth every 6 (six) hours as needed.   LORATADINE-PSEUDOEPHEDRINE (CLARITIN-D 24 HOUR) 10-240 MG 24 HR TABLET    Take 1 tablet by mouth daily.     Arby BarrettePfeiffer, Ali Mohl, MD 02/11/17 [phone]

## 2017-02-11 NOTE — ED Triage Notes (Signed)
Pt reports posterior headache since yesterday, also reports feeling "funny" when he moves. States he is diabetic, reports CBG at home was 95. Pt a/ox4, resp e/u, nad.

## 2017-02-11 NOTE — ED Notes (Signed)
No questions or concerns

## 2017-02-15 ENCOUNTER — Encounter: Payer: Self-pay | Admitting: Family Medicine

## 2017-02-15 ENCOUNTER — Ambulatory Visit (INDEPENDENT_AMBULATORY_CARE_PROVIDER_SITE_OTHER): Payer: Managed Care, Other (non HMO) | Admitting: Family Medicine

## 2017-02-15 VITALS — BP 130/64 | HR 58 | Temp 97.8°F | Resp 14 | Ht 67.0 in | Wt 158.0 lb

## 2017-02-15 DIAGNOSIS — E119 Type 2 diabetes mellitus without complications: Secondary | ICD-10-CM

## 2017-02-15 DIAGNOSIS — G44229 Chronic tension-type headache, not intractable: Secondary | ICD-10-CM | POA: Diagnosis not present

## 2017-02-15 DIAGNOSIS — R946 Abnormal results of thyroid function studies: Secondary | ICD-10-CM | POA: Diagnosis not present

## 2017-02-15 DIAGNOSIS — R7989 Other specified abnormal findings of blood chemistry: Secondary | ICD-10-CM

## 2017-02-15 LAB — POCT GLYCOSYLATED HEMOGLOBIN (HGB A1C): HEMOGLOBIN A1C: 5.1

## 2017-02-15 MED ORDER — BUTALBITAL-APAP-CAFFEINE 50-325-40 MG PO TABS: 1.0000 | ORAL_TABLET | Freq: Two times a day (BID) | ORAL | 0 refills | Status: DC | PRN

## 2017-02-15 NOTE — Patient Instructions (Addendum)
Abnormal TSH:  Follow up by phone with laboratory results   Chronic tension type headache, not intractable:   Fioricet Every 12 hours as needed for chronic headaches  Recommend a routine eye examination ([facility])

## 2017-02-15 NOTE — Progress Notes (Signed)
Subjective:    Patient ID: Devon Pace, male    DOB: 1976/03/05, 41 y.o.   MRN: [id number]  [name], a 41 year old patient presents for a follow up of chronic headache pain. He was evaluated in the emergency department on 02/11/2017 for a persistent headache. He says that headaches occur intermittently.    Headache   This is a chronic problem. The current episode started more than 1 year ago (Reviewed CT from 12/03/2016, which was unremarkable). The problem has been unchanged. The pain is located in the bilateral region. The pain does not radiate. The pain quality is similar to prior headaches. The quality of the pain is described as boring, squeezing and band-like. The pain is at a severity of 4/10. The pain is moderate (Typically takes Devon Pace or Ibuprofen without sustained relief. ). Pertinent negatives include no abdominal pain, anorexia, blurred vision, drainage, ear pain, eye pain, fever, muscle aches, nausea, neck pain, photophobia, scalp tenderness, swollen glands, visual change or weight loss. He has tried darkened room, acetaminophen and NSAIDs for the symptoms. The treatment provided mild relief. His past medical history is significant for sinus disease. There is no history of cancer, cluster headaches, hypertension, immunosuppression, migraine headaches, migraines in the family, obesity, pseudotumor cerebri, recent head traumas or TMJ.  Thyroid Problem  Presents for follow-up visit. Patient reports no anxiety, cold intolerance, constipation, depressed mood, diaphoresis, diarrhea, dry skin, fatigue, hair loss, heat intolerance, hoarse voice, leg swelling, palpitations, tremors, visual change or weight loss.   Past Medical History:  Diagnosis Date  . Diabetes mellitus without complication [facility])    Social History   Social History  . Marital status: Single    Spouse name: N/A  . Number of children: N/A  . Years of education: N/A   Occupational History  . Not on file.    Social History Main Topics  . Smoking status: Current Every Day Smoker    Packs/day: 0.25  . Smokeless tobacco: Never Used  . Alcohol use Yes     Comment: occ  . Drug use: No  . Sexual activity: Not on file   Other Topics Concern  . Not on file   Social History Narrative  . No narrative on file   Immunization History  Administered Date(s) Administered  . Pneumococcal Polysaccharide-23 08/18/2016  . Tdap 11/16/2016   Review of Systems  Constitutional: Negative for diaphoresis, fatigue, fever and weight loss.  HENT: Negative.  Negative for ear pain and hoarse voice.   Eyes: Negative for blurred vision, photophobia, pain and visual disturbance.  Respiratory: Negative.   Cardiovascular: Negative.  Negative for palpitations.  Gastrointestinal: Negative.  Negative for abdominal pain, anorexia, constipation, diarrhea and nausea.  Endocrine: Negative for cold intolerance and heat intolerance.  Genitourinary: Negative.   Musculoskeletal: Negative.  Negative for neck pain.  Skin: Negative.   Allergic/Immunologic: Negative.  Negative for immunocompromised state.  Neurological: Positive for headaches. Negative for tremors.  Hematological: Negative.   Psychiatric/Behavioral: Negative.  The patient is not nervous/anxious.        Objective:   Physical Exam  Constitutional: He is oriented to person, place, and time. He appears well-developed and well-nourished.  HENT:  Head: Normocephalic and atraumatic.  Right Ear: External ear normal.  Left Ear: External ear normal.  Nose: Nose normal.  Mouth/Throat: Oropharynx is clear and moist.  Eyes: Conjunctivae and EOM are normal. Pupils are equal, round, and reactive to light.  Neck: Normal range of motion. Neck supple.  Cardiovascular: Normal rate, regular rhythm, normal heart sounds and intact distal pulses.   Pulmonary/Chest: Effort normal and breath sounds normal.  Abdominal: Soft. Bowel sounds are normal.  Neurological: He is  alert and oriented to person, place, and time. He has normal reflexes. No cranial nerve deficit or sensory deficit. He exhibits normal muscle tone. He displays no seizure activity. Coordination and gait normal.  Skin: Skin is warm and dry.  Psychiatric: He has a normal mood and affect. His behavior is normal. Judgment and thought content normal.      BP 130/64 (BP Location: Left Arm, Patient Position: Sitting, Cuff Size: Normal)   Pulse (!) 58   Temp 97.8 F (36.6 C) (Oral)   Resp 14   Ht 5\' 7"  (1.702 m)   Wt 158 lb (71.7 kg)   SpO2 100%   BMI 24.75 kg/m  Assessment & Plan:  1. Chronic tension-type headache, not intractable Will start a trial of Fiorcet 50-325-40 mg twice daily as needed.  Discussed maintaining a headache diary If chronic headaches doe not improve, may warrant a referral to neurology.  - butalbital-acetaminophen-caffeine (FIORICET, ESGIC) 50-325-40 MG tablet; Take 1 tablet by mouth 2 (two) times daily as needed for headache.  Dispense: 30 tablet; Refill: 0  2. Type 2 diabetes mellitus without complication, without long-term current use of insulin (HCC) Patient has been taking medications consistently. Hemoglobin has decreased to 5.1, will discontinue Metformin.  - HgB A1c  3. Abnormal TSH - Thyroid Panel With TSH   RTC: 1 month for chronic headache   Erin SonsLaChina Rennis PettyMoore Hollis  MSN, FNP-C [facility] [address]  [place], [place] [id number] [phone]

## 2017-02-16 LAB — THYROID PANEL WITH TSH
FREE THYROXINE INDEX: 1.9 (ref 1.4–3.8)
T3 Uptake: 28 % (ref 22–35)
T4, Total: 6.7 ug/dL (ref 4.5–12.0)
TSH: 0.26 m[IU]/L — AB (ref 0.40–4.50)

## 2017-02-18 ENCOUNTER — Ambulatory Visit: Payer: Managed Care, Other (non HMO) | Admitting: Family Medicine

## 2017-03-05 ENCOUNTER — Emergency Department (HOSPITAL_COMMUNITY)
Admission: EM | Admit: 2017-03-05 | Discharge: 2017-03-05 | Disposition: A | Discharge status: Home / Self Care | Payer: Managed Care, Other (non HMO) | Attending: Emergency Medicine | Admitting: Emergency Medicine

## 2017-03-05 ENCOUNTER — Emergency Department (HOSPITAL_COMMUNITY): Payer: Managed Care, Other (non HMO)

## 2017-03-05 ENCOUNTER — Encounter (HOSPITAL_COMMUNITY): Payer: Self-pay

## 2017-03-05 DIAGNOSIS — W1830XA Fall on same level, unspecified, initial encounter: Secondary | ICD-10-CM | POA: Diagnosis not present

## 2017-03-05 DIAGNOSIS — Y929 Unspecified place or not applicable: Secondary | ICD-10-CM | POA: Insufficient documentation

## 2017-03-05 DIAGNOSIS — Z79899 Other long term (current) drug therapy: Secondary | ICD-10-CM | POA: Insufficient documentation

## 2017-03-05 DIAGNOSIS — E119 Type 2 diabetes mellitus without complications: Secondary | ICD-10-CM | POA: Insufficient documentation

## 2017-03-05 DIAGNOSIS — F17200 Nicotine dependence, unspecified, uncomplicated: Secondary | ICD-10-CM | POA: Insufficient documentation

## 2017-03-05 DIAGNOSIS — Y999 Unspecified external cause status: Secondary | ICD-10-CM | POA: Insufficient documentation

## 2017-03-05 DIAGNOSIS — S9032XA Contusion of left foot, initial encounter: Secondary | ICD-10-CM | POA: Diagnosis not present

## 2017-03-05 DIAGNOSIS — Y939 Activity, unspecified: Secondary | ICD-10-CM | POA: Diagnosis not present

## 2017-03-05 DIAGNOSIS — S99922A Unspecified injury of left foot, initial encounter: Secondary | ICD-10-CM | POA: Diagnosis present

## 2017-03-05 MED ORDER — IBUPROFEN 600 MG PO TABS: 600.0000 mg | ORAL_TABLET | Freq: Four times a day (QID) | ORAL | 0 refills | Status: DC | PRN

## 2017-03-05 NOTE — ED Triage Notes (Signed)
Pt presents with 2 day h/o L foot pain, reports he was at work yesterday and struck it on a jack.  Pt able to bear weight.

## 2017-03-05 NOTE — ED Provider Notes (Signed)
[facility] Provider Note   CSN: [id number] Arrival date & time: 03/05/17  1317  By signing my name below, I, Linna DarnerRussell Turner, attest that this documentation has been prepared under the direction and in the presence of Fayrene HelperBowie Shanyn Preisler, PA-C . Electronically Signed: Linna Darnerussell Turner, Scribe. 03/05/2017. 2:14 PM.  History   Chief Complaint Chief Complaint  Patient presents with  . Foot Pain   The history is provided by the patient. No language interpreter was used.    HPI Comments: Devon Cliffric Pace is a 41 y.o. male with PMHx of DM2 who presents to the Emergency Department for evaluation of a left foot injury sustained yesterday. He states he struck his left foot directly on a pallet jack at work. Patient reports significant pain to the lateral dorsum of his left foot as well as some swelling. He is ambulatory with difficulty secondary to pain and endorses pain exacerbation with weight bearing. Patient rates his current pain at 7.5/10 in severity and describes it as sharp. Patient has tried ibuprofen, ice, and elevation without significant relief. He notes that he is on his feet often at work. He denies left ankle pain, numbness/tingling, bruising, open wounds, or any other associated symptoms.  Past Medical History:  Diagnosis Date  . Diabetes mellitus without complication [facility](HCC)     Patient Active Problem List   Diagnosis Date Noted  . Tobacco dependence 11/16/2016  . Abnormal TSH 11/16/2016  . Type 2 diabetes mellitus (HCC) 08/18/2016    History reviewed. No pertinent surgical history.     Home Medications    Prior to Admission medications   Medication Sig Start Date End Date Taking? Authorizing Provider  butalbital-acetaminophen-caffeine (FIORICET, ESGIC) [phone] MG tablet Take 1 tablet by mouth 2 (two) times daily as needed for headache. 02/15/17   Massie MaroonHollis, Lachina M, FNP  ibuprofen (ADVIL,MOTRIN) 600 MG tablet Take 1 tablet (600 mg total) by mouth every 6 (six) hours as needed.  03/05/17   Fayrene Helperran, Kweli Grassel, PA-C  loratadine-pseudoephedrine (CLARITIN-D 24 HOUR) 10-240 MG 24 hr tablet Take 1 tablet by mouth daily. 02/11/17   Arby BarrettePfeiffer, Marcy, MD    Family History Family History  Problem Relation Age of Onset  . Diabetes Mother   . Diabetes Maternal Grandmother     Social History Social History  Substance Use Topics  . Smoking status: Current Every Day Smoker    Packs/day: 0.25  . Smokeless tobacco: Never Used  . Alcohol use Yes     Comment: occ     Allergies   Patient has no known allergies.   Review of Systems Review of Systems  Musculoskeletal: Positive for arthralgias and joint swelling.  Skin: Negative for color change and wound.  Neurological: Negative for numbness.   Physical Exam Updated Vital Signs BP 127/75 (BP Location: Left Arm)   Pulse (!) 58   Temp 97.9 F (36.6 C)   Resp 18   SpO2 100%   Physical Exam  Constitutional: He is oriented to person, place, and time. He appears well-developed and well-nourished. No distress.  HENT:  Head: Normocephalic and atraumatic.  Eyes: Conjunctivae and EOM are normal.  Neck: Neck supple. No tracheal deviation present.  Cardiovascular: Normal rate.   Pulmonary/Chest: Effort normal. No respiratory distress.  Musculoskeletal: Normal range of motion.  Left foot: Tenderness to lateral dorsum of foot on palpation with mild swelling but no crepitus or deformity. Able to move all toes. Brisk cap refill. DP pulse is palpable.  Neurological: He is alert and oriented to  person, place, and time.  Skin: Skin is warm and dry.  Psychiatric: He has a normal mood and affect. His behavior is normal.  Nursing note and vitals reviewed.  ED Treatments / Results  Labs (all labs ordered are listed, but only abnormal results are displayed) Labs Reviewed - No data to display  EKG  EKG Interpretation None       Radiology Dg Foot Complete Left  Result Date: 03/05/2017 CLINICAL DATA:  Ree Kida fell on foot, pain at  fifth metatarsal EXAM: LEFT FOOT - COMPLETE 3+ VIEW COMPARISON:  None FINDINGS: Osseous mineralization normal. Joint spaces preserved. No acute fracture, dislocation or bone destruction. IMPRESSION: No acute osseous abnormalities. Electronically Signed   By: Ulyses Southward M.D.   On: 03/05/2017 14:05    Procedures Procedures (including critical care time)  DIAGNOSTIC STUDIES: Oxygen Saturation is 100% on RA, normal by my interpretation.    COORDINATION OF CARE: 2:14 PM Discussed treatment plan with pt at bedside and pt agreed to plan.  Medications Ordered in ED Medications - No data to display   Initial Impression / Assessment and Plan / ED Course  I have reviewed the triage vital signs and the nursing notes.  Pertinent labs & imaging results that were available during my care of the patient were reviewed by me and considered in my medical decision making (see chart for details).     Patient with foot pain. X-Ray negative for obvious fracture or dislocation.  Pt advised to follow up with orthopedics. Patient given ACE wrap while in ED, conservative therapy recommended and discussed. Patient will be discharged home & is agreeable with above plan. Returns precautions discussed. Pt appears safe for discharge.  Final Clinical Impressions(s) / ED Diagnoses   Final diagnoses:  Contusion of left foot, initial encounter    New Prescriptions Current Discharge Medication List     I personally performed the services described in this documentation, which was scribed in my presence. The recorded information has been reviewed and is accurate.      Fayrene Helper, PA-C 03/05/17 1416    Long, Arlyss Repress, MD 03/05/17 2028

## 2017-03-05 NOTE — ED Notes (Signed)
Patient transported to X-ray 

## 2017-03-05 NOTE — ED Notes (Signed)
Returned from xray

## 2017-03-05 NOTE — ED Notes (Signed)
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 C/o left lateral foot pain. No deformity.

## 2017-03-29 ENCOUNTER — Encounter: Payer: Self-pay | Admitting: Family Medicine

## 2017-03-29 ENCOUNTER — Ambulatory Visit (INDEPENDENT_AMBULATORY_CARE_PROVIDER_SITE_OTHER): Payer: Managed Care, Other (non HMO) | Admitting: Family Medicine

## 2017-03-29 VITALS — BP 128/69 | HR 60 | Temp 98.2°F | Resp 16 | Ht 67.0 in | Wt 155.0 lb

## 2017-03-29 DIAGNOSIS — R946 Abnormal results of thyroid function studies: Secondary | ICD-10-CM | POA: Diagnosis not present

## 2017-03-29 DIAGNOSIS — F17200 Nicotine dependence, unspecified, uncomplicated: Secondary | ICD-10-CM | POA: Diagnosis not present

## 2017-03-29 DIAGNOSIS — R42 Dizziness and giddiness: Secondary | ICD-10-CM

## 2017-03-29 DIAGNOSIS — R7989 Other specified abnormal findings of blood chemistry: Secondary | ICD-10-CM

## 2017-03-29 DIAGNOSIS — R531 Weakness: Secondary | ICD-10-CM

## 2017-03-29 LAB — COMPLETE METABOLIC PANEL WITH GFR
ALBUMIN: 4.2 g/dL (ref 3.6–5.1)
ALK PHOS: 78 U/L (ref 40–115)
ALT: 22 U/L (ref 9–46)
AST: 17 U/L (ref 10–40)
BILIRUBIN TOTAL: 0.4 mg/dL (ref 0.2–1.2)
BUN: 12 mg/dL (ref 7–25)
CO2: 23 mmol/L (ref 20–31)
Calcium: 9.3 mg/dL (ref 8.6–10.3)
Chloride: 107 mmol/L (ref 98–110)
Creat: 0.86 mg/dL (ref 0.60–1.35)
GLUCOSE: 63 mg/dL — AB (ref 65–99)
Potassium: 4.8 mmol/L (ref 3.5–5.3)
SODIUM: 139 mmol/L (ref 135–146)
TOTAL PROTEIN: 6.7 g/dL (ref 6.1–8.1)

## 2017-03-29 LAB — CBC WITH DIFFERENTIAL/PLATELET
BASOS ABS: 0 {cells}/uL (ref 0–200)
Basophils Relative: 0 %
EOS PCT: 2 %
Eosinophils Absolute: 122 cells/uL (ref 15–500)
HCT: 44 % (ref 38.5–50.0)
Hemoglobin: 13.3 g/dL (ref 13.2–17.1)
Lymphocytes Relative: 45 %
Lymphs Abs: 2745 cells/uL (ref 850–3900)
MCH: 23.3 pg — AB (ref 27.0–33.0)
MCHC: 30.2 g/dL — AB (ref 32.0–36.0)
MCV: 76.9 fL — ABNORMAL LOW (ref 80.0–100.0)
MONOS PCT: 11 %
MPV: 9.6 fL (ref 7.5–12.5)
Monocytes Absolute: 671 cells/uL (ref 200–950)
NEUTROS ABS: 2562 {cells}/uL (ref 1500–7800)
Neutrophils Relative %: 42 %
PLATELETS: 268 10*3/uL (ref 140–400)
RBC: 5.72 MIL/uL (ref 4.20–5.80)
RDW: 13.3 % (ref 11.0–15.0)
WBC: 6.1 10*3/uL (ref 3.8–10.8)

## 2017-03-29 LAB — POCT URINALYSIS DIP (DEVICE)
Bilirubin Urine: NEGATIVE
GLUCOSE, UA: NEGATIVE mg/dL
Ketones, ur: NEGATIVE mg/dL
LEUKOCYTES UA: NEGATIVE
NITRITE: NEGATIVE
Protein, ur: NEGATIVE mg/dL
Specific Gravity, Urine: 1.015 (ref 1.005–1.030)
UROBILINOGEN UA: 0.2 mg/dL (ref 0.0–1.0)
pH: 6.5 (ref 5.0–8.0)

## 2017-03-29 LAB — TSH: TSH: 0.4 m[IU]/L (ref 0.40–4.50)

## 2017-03-29 LAB — GLUCOSE, CAPILLARY: Glucose-Capillary: 89 mg/dL (ref 65–99)

## 2017-03-29 MED ORDER — MECLIZINE HCL 12.5 MG PO TABS
12.5000 mg | ORAL_TABLET | Freq: Three times a day (TID) | ORAL | 0 refills | Status: DC | PRN
Start: 2017-03-29 — End: 2017-03-29

## 2017-03-29 MED ORDER — MECLIZINE HCL 12.5 MG PO TABS: 12.5000 mg | ORAL_TABLET | Freq: Three times a day (TID) | ORAL | 0 refills | Status: DC | PRN

## 2017-03-29 NOTE — Patient Instructions (Addendum)
Hold Metformin, will review labs as they become available.   Will start a trial of Meclizine 12.5 mg every 8 hours as needed.   Reviewed EKG, within normal limits. Will resend referral to endocrinology for Hyperthyroidism.    Meclizine tablets or capsules What is this medicine? MECLIZINE (MEK li zeen) is an antihistamine. It is used to prevent nausea, vomiting, or dizziness caused by motion sickness. It is also used to prevent and treat vertigo (extreme dizziness or a feeling that you or your surroundings are tilting or spinning around). This medicine may be used for other purposes; ask your health care provider or pharmacist if you have questions. COMMON BRAND NAME(S): Antivert, Dramamine Less Drowsy, Medivert, Meni-D What should I tell my health care provider before I take this medicine? They need to know if you have any of these conditions: -glaucoma -lung or breathing disease, like asthma -problems urinating -prostate disease -stomach or intestine problems -an unusual or allergic reaction to meclizine, other medicines, foods, dyes, or preservatives -pregnant or trying to get pregnant -breast-feeding How should I use this medicine? Take this medicine by mouth with a glass of water. Follow the directions on the prescription label. If you are using this medicine to prevent motion sickness, take the dose at least 1 hour before travel. If it upsets your stomach, take it with food or milk. Take your doses at regular intervals. Do not take your medicine more often than directed. Talk to your pediatrician regarding the use of this medicine in children. Special care may be needed. Overdosage: If you think you have taken too much of this medicine contact a poison control center or emergency room at once. NOTE: This medicine is only for you. Do not share this medicine with others. What if I miss a dose? If you miss a dose, take it as soon as you can. If it is almost time for your next dose, take  only that dose. Do not take double or extra doses. What may interact with this medicine? Do not take this medicine with any of the following medications: -MAOIs like Carbex, Eldepryl, Marplan, Nardil, and Parnate This medicine may also interact with the following medications: -alcohol -antihistamines for allergy, cough and cold -certain medicines for anxiety or sleep -certain medicines for depression, like amitriptyline, fluoxetine, sertraline -certain medicines for seizures like phenobarbital, primidone -general anesthetics like halothane, isoflurane, methoxyflurane, propofol -local anesthetics like lidocaine, pramoxine, tetracaine -medicines that relax muscles for surgery -narcotic medicines for pain -phenothiazines like chlorpromazine, mesoridazine, prochlorperazine, thioridazine This list may not describe all possible interactions. Give your health care provider a list of all the medicines, herbs, non-prescription drugs, or dietary supplements you use. Also tell them if you smoke, drink alcohol, or use illegal drugs. Some items may interact with your medicine. What should I watch for while using this medicine? Tell your doctor or healthcare professional if your symptoms do not start to get better or if they get worse. You may get drowsy or dizzy. Do not drive, use machinery, or do anything that needs mental alertness until you know how this medicine affects you. Do not stand or sit up quickly, especially if you are an older patient. This reduces the risk of dizzy or fainting spells. Alcohol may interfere with the effect of this medicine. Avoid alcoholic drinks. Your mouth may get dry. Chewing sugarless gum or sucking hard candy, and drinking plenty of water may help. Contact your doctor if the problem does not go away or is severe. This  medicine may cause dry eyes and blurred vision. If you wear contact lenses you may feel some discomfort. Lubricating drops may help. See your eye doctor if  the problem does not go away or is severe. What side effects may I notice from receiving this medicine? Side effects that you should report to your doctor or health care professional as soon as possible: -feeling faint or lightheaded, falls -fast, irregular heartbeat Side effects that usually do not require medical attention (report to your doctor or health care professional if they continue or are bothersome): -constipation -headache -trouble passing urine or change in the amount of urine -trouble sleeping -upset stomach This list may not describe all possible side effects. Call your doctor for medical advice about side effects. You may report side effects to FDA at 1-800-FDA-1088. Where should I keep my medicine? Keep out of the reach of children. Store at room temperature between 15 and 30 degrees C (59 and 86 degrees F). Keep container tightly closed. Throw away any unused medicine after the expiration date. NOTE: This sheet is a summary. It may not cover all possible information. If you have questions about this medicine, talk to your doctor, pharmacist, or health care provider.  2018 Elsevier/Gold Standard (2015-09-25 19:41:02)  Dizziness Dizziness is a common problem. It is a feeling of unsteadiness or light-headedness. You may feel like you are about to faint. Dizziness can lead to injury if you stumble or fall. Anyone can become dizzy, but dizziness is more common in older adults. This condition can be caused by a number of things, including medicines, dehydration, or illness. Follow these instructions at home: Taking these steps may help with your condition: Eating and drinking  Drink enough fluid to keep your urine clear or pale yellow. This helps to keep you from becoming dehydrated. Try to drink more clear fluids, such as water.  Do not drink alcohol.  Limit your caffeine intake if directed by your health care provider.  Limit your salt intake if directed by your health  care provider. Activity  Avoid making quick movements. ? Rise slowly from chairs and steady yourself until you feel okay. ? In the morning, first sit up on the side of the bed. When you feel okay, stand slowly while you hold onto something until you know that your balance is fine.  Move your legs often if you need to stand in one place for a long time. Tighten and relax your muscles in your legs while you are standing.  Do not drive or operate heavy machinery if you feel dizzy.  Avoid bending down if you feel dizzy. Place items in your home so that they are easy for you to reach without leaning over. Lifestyle  Do not use any tobacco products, including cigarettes, chewing tobacco, or electronic cigarettes. If you need help quitting, ask your health care provider.  Try to reduce your stress level, such as with yoga or meditation. Talk with your health care provider if you need help. General instructions  Watch your dizziness for any changes.  Take medicines only as directed by your health care provider. Talk with your health care provider if you think that your dizziness is caused by a medicine that you are taking.  Tell a friend or a family member that you are feeling dizzy. If he or she notices any changes in your behavior, have this person call your health care provider.  Keep all follow-up visits as directed by your health care provider. This is  important. Contact a health care provider if:  Your dizziness does not go away.  Your dizziness or light-headedness gets worse.  You feel nauseous.  You have reduced hearing.  You have new symptoms.  You are unsteady on your feet or you feel like the room is spinning. Get help right away if:  You vomit or have diarrhea and are unable to eat or drink anything.  You have problems talking, walking, swallowing, or using your arms, hands, or legs.  You feel generally weak.  You are not thinking clearly or you have trouble forming  sentences. It may take a friend or family member to notice this.  You have chest pain, abdominal pain, shortness of breath, or sweating.  Your vision changes.  You notice any bleeding.  You have a headache.  You have neck pain or a stiff neck.  You have a fever. This information is not intended to replace advice given to you by your health care provider. Make sure you discuss any questions you have with your health care provider. Document Released: 02/17/2001 Document Revised: 01/30/2016 Document Reviewed: 08/20/2014 Elsevier Interactive Patient Education  2017 [employer]

## 2017-03-29 NOTE — Progress Notes (Signed)
Subjective:    Patient ID: Devon Pace, male    DOB: Oct 04, 1975, 41 y.o.   MRN: [id number]  HPI  Devon Pace, a 41 year old male with a history of prediabetes presents complaining of weakness and dizziness upon awakening this am. He awakened with dizziness on standing. He says that it was difficult to get out of bed. Patient presents with dizziness .  The dizziness has been present for several hours. The patient describes the symptoms as weakness and "light headed". Mr. Hechler denies aural pressure, otalgia, tinnitus, and hearing loss. He has not used any OTC interventions to alleviate symptoms.   Past Medical History:  Diagnosis Date  . Diabetes mellitus without complication [facility])    Social History   Social History  . Marital status: Single    Spouse name: N/A  . Number of children: N/A  . Years of education: N/A   Occupational History  . Not on file.   Social History Main Topics  . Smoking status: Current Every Day Smoker    Packs/day: 0.25  . Smokeless tobacco: Never Used  . Alcohol use Yes     Comment: occ  . Drug use: No  . Sexual activity: Not on file   Other Topics Concern  . Not on file   Social History Narrative  . No narrative on file   Immunization History  Administered Date(s) Administered  . Pneumococcal Polysaccharide-23 08/18/2016  . Tdap 11/16/2016  No Known Allergies Review of Systems  HENT: Negative.   Eyes: Negative.   Respiratory: Negative.   Cardiovascular: Negative.  Negative for chest pain and palpitations.  Gastrointestinal: Negative.   Endocrine: Negative for polydipsia, polyphagia and polyuria.  Genitourinary: Negative.   Musculoskeletal: Negative.   Skin: Negative.   Allergic/Immunologic: Negative for immunocompromised state.  Neurological: Positive for dizziness and weakness.  Hematological: Negative.   Psychiatric/Behavioral: Negative.  Negative for agitation and confusion. The patient is not nervous/anxious.        Objective:    Physical Exam  Constitutional: He is oriented to person, place, and time. He appears well-developed and well-nourished.  HENT:  Head: Normocephalic and atraumatic.  Right Ear: External ear normal.  Left Ear: External ear normal.  Nose: Nose normal.  Mouth/Throat: Oropharynx is clear and moist.  Eyes: Pupils are equal, round, and reactive to light. Conjunctivae and EOM are normal.  Cardiovascular: Normal rate, regular rhythm, normal heart sounds and intact distal pulses.  Exam reveals no gallop and no friction rub.   No murmur heard. Pulmonary/Chest: Effort normal and breath sounds normal.  Abdominal: Soft. Bowel sounds are normal.  Musculoskeletal: Normal range of motion.  Neurological: He is alert and oriented to person, place, and time. He has normal strength and normal reflexes. No cranial nerve deficit. He displays a negative Romberg sign. Gait normal.  Skin: Skin is warm and dry.  Psychiatric: He has a normal mood and affect. His behavior is normal. Judgment and thought content normal.     BP 128/69 (BP Location: Left Arm, Patient Position: Sitting, Cuff Size: Normal)   Pulse 60   Temp 98.2 F (36.8 C) (Oral)   Resp 16   Ht 5\' 7"  (1.702 m)   Wt 155 lb (70.3 kg)   SpO2 100%   BMI 24.28 kg/m  Assessment & Plan:  1. Weakness Reviewed EKG; Normal sinus rhythm.  Hemoglobin a1C has decreased to 4.9. I suspect that Metformin may be causing weakness. Will discontinue and follow up by phone on 04/02/2017.  Fasting CBG is 85.  Will check TSH, was previously 0.26, a referral was sent to endocrinology Will review CBC and CMP as results become available.  - POCT CBG monitoring - Hemoglobin A1c - COMPLETE METABOLIC PANEL WITH GFR - CBC with Differential - TSH - EKG 12-Lead  2. Abnormal TSH Previous TSH was 0.26, will repeat on today due to current symptoms.   3. Dizziness Dizziness not reproducible on physical exam. Will start a trial of meclizine 12.5 mg every 8 hours as needed.   - meclizine (ANTIVERT) 12.5 MG tablet; Take 1 tablet (12.5 mg total) by mouth 3 (three) times daily as needed for dizziness.  Dispense: 30 tablet; Refill: 0  4. Tobacco dependence Patient continues to decrease smoking. Smoking cessation instruction/counseling given:  counseled patient on the dangers of tobacco use, advised patient to stop smoking, and reviewed strategies to maximize success  RTC: Will follow up by phone with any abnormal laboratory results.    Nolon NationsLaChina Moore Jazleen Robeck  MSN, FNP-C [facility] Patient [facility] [address]  [place], [place] [id number] [phone]

## 2017-03-30 ENCOUNTER — Telehealth: Payer: Self-pay | Admitting: Family Medicine

## 2017-03-30 DIAGNOSIS — Z8639 Personal history of other endocrine, nutritional and metabolic disease: Secondary | ICD-10-CM

## 2017-03-30 DIAGNOSIS — R7989 Other specified abnormal findings of blood chemistry: Secondary | ICD-10-CM

## 2017-03-30 LAB — HEMOGLOBIN A1C
Hgb A1c MFr Bld: 4.9 % (ref <5.7)
Mean Plasma Glucose: 94 mg/dL

## 2017-03-30 NOTE — Telephone Encounter (Signed)
Hemoglobin a1C is decreased at 4.9, will discontinue Metformin 500 mg twice daily. Patient will follow up for labs in 3 months.    Nolon NationsLaChina Moore Hollis  MSN, FNP-C [facility] Patient [facility] [address]  [place], [place] [address] [phone]

## 2017-05-20 ENCOUNTER — Ambulatory Visit: Payer: Managed Care, Other (non HMO) | Admitting: Family Medicine

## 2017-05-27 ENCOUNTER — Encounter: Payer: Self-pay | Admitting: Family Medicine

## 2017-05-27 ENCOUNTER — Ambulatory Visit (INDEPENDENT_AMBULATORY_CARE_PROVIDER_SITE_OTHER): Payer: Managed Care, Other (non HMO) | Admitting: Family Medicine

## 2017-05-27 VITALS — BP 131/77 | HR 62 | Temp 97.7°F | Resp 16 | Ht 67.0 in | Wt 154.0 lb

## 2017-05-27 DIAGNOSIS — R079 Chest pain, unspecified: Secondary | ICD-10-CM | POA: Diagnosis not present

## 2017-05-27 DIAGNOSIS — R030 Elevated blood-pressure reading, without diagnosis of hypertension: Secondary | ICD-10-CM | POA: Diagnosis not present

## 2017-05-27 DIAGNOSIS — R7989 Other specified abnormal findings of blood chemistry: Secondary | ICD-10-CM

## 2017-05-27 DIAGNOSIS — R946 Abnormal results of thyroid function studies: Secondary | ICD-10-CM

## 2017-05-27 DIAGNOSIS — F17200 Nicotine dependence, unspecified, uncomplicated: Secondary | ICD-10-CM

## 2017-05-27 DIAGNOSIS — Z13220 Encounter for screening for lipoid disorders: Secondary | ICD-10-CM

## 2017-05-27 NOTE — Progress Notes (Signed)
Subjective:    Devon Pace is a 41 y.o. male who presents for evaluation of elevated blood pressures. Patient was at work this am an had blood pressure check and it was 152/90. Patient felt fatigued and was told to leave work and follow up with primary provider. Blood pressure is at goal on arrival. He says that he feels better since getting rest and hydrating. Patient denies: dyspnea, fatigue, lower extremity edema, orthopnea, palpitations, syncope and tachypnea. Cardiovascular risk factors include: diabetes mellitus and smoking/ tobacco exposure. He endorses periodic chest pain.  Past Medical History:  Diagnosis Date  . Diabetes mellitus without complication [facility])    Social History   Social History  . Marital status: Single    Spouse name: N/A  . Number of children: N/A  . Years of education: N/A   Occupational History  . Not on file.   Social History Main Topics  . Smoking status: Current Every Day Smoker    Packs/day: 0.25  . Smokeless tobacco: Never Used  . Alcohol use Yes     Comment: occ  . Drug use: No  . Sexual activity: Not on file   Other Topics Concern  . Not on file   Social History Narrative  . No narrative on file   No Known Allergies Immunization History  Administered Date(s) Administered  . Pneumococcal Polysaccharide-23 08/18/2016  . Tdap 11/16/2016    Review of Systems Review of Systems  Constitutional: Negative for malaise/fatigue and weight loss.  HENT: Negative.   Eyes: Negative.   Respiratory: Negative.   Cardiovascular: Positive for chest pain. Negative for palpitations, orthopnea, claudication, leg swelling and PND.  Gastrointestinal: Negative.   Genitourinary: Negative.  Negative for dysuria and urgency.  Musculoskeletal: Negative.   Skin: Negative.   Neurological: Negative.   Endo/Heme/Allergies: Negative.   Psychiatric/Behavioral: Negative.  Negative for depression, hallucinations, memory loss, substance abuse and suicidal ideas. The  patient is not nervous/anxious and does not have insomnia.     Objective:     BP 131/77 (BP Location: Left Arm, Patient Position: Sitting, Cuff Size: Normal)   Pulse 62   Temp 97.7 F (36.5 C) (Oral)   Resp 16   Ht  (1.702 m)   Wt 154 lb (69.9 kg)   SpO2 100%   BMI 24.12 kg/m   General Appearance:    Alert, cooperative, no distress, appears stated age  Head:    Normocephalic, without obvious abnormality, atraumatic  Eyes:    PERRL, conjunctiva/corneas clear, EOM's intact, fundi    benign, both eyes       Ears:    Normal TM's and external ear canals, both ears  Nose:   Nares normal, septum midline, mucosa normal, no drainage    or sinus tenderness  Throat:   Lips, mucosa, and tongue normal; teeth and gums normal  Neck:   Supple, symmetrical, trachea midline, no adenopathy;       thyroid:  No enlargement/tenderness/nodules; no carotid   bruit or JVD  Back:     Symmetric, no curvature, ROM normal, no CVA tenderness  Lungs:     Clear to auscultation bilaterally, respirations unlabored  Chest wall:    No tenderness or deformity  Heart:    Regular rate and rhythm, S1 and S2 normal, no murmur, rub   or gallop  Abdomen:     Soft, non-tender, bowel sounds active all four quadrants,    no masses, no organomegaly  Extremities:   Extremities normal, atraumatic, no cyanosis  or edema  Pulses:   2+ and symmetric all extremities  Skin:   Skin color, texture, turgor normal, no rashes or lesions  Lymph nodes:   Cervical, supraclavicular, and axillary nodes normal  Neurologic:   CNII-XII intact. Normal strength, sensation and reflexes      throughout    Cardiographics ECG: normal sinus rhythm    Assessment:    Hypertension, prehypertension . No evidence of target organ damage.  BP 131/77 (BP Location: Left Arm, Patient Position: Sitting, Cuff Size: Normal)   Pulse 62   Temp 97.7 F (36.5 C) (Oral)   Resp 16   Ht  (1.702 m)   Wt 154 lb (69.9 kg)   SpO2 100%   BMI 24.12  kg/m  Plan:  1. Chest pain, unspecified type Reviewed EKG, borderline. Refrain from daily caffeine use Recommend smoking cessation  - EKG 12-Lead - COMPLETE METABOLIC PANEL WITH GFR - Troponin I  2. Elevated blood pressure reading Blood pressure is mildly elevated. He does not warrant medication at this time. Patient should continue to check blood pressure at work.   3. Tobacco dependence Smoking cessation instruction/counseling given:  counseled patient on the dangers of tobacco use, advised patient to stop smoking, and reviewed strategies to maximize success  4. Abnormal TSH - TSH  5. Screening cholesterol level The 10-year ASCVD risk score Denman George DC Jr., et al., 2013) is: 9.2%   Values used to calculate the score:     Age: 54 years     Sex: Male     Is Non-Hispanic African American: Yes     Diabetic: Yes     Tobacco smoker: Yes     Systolic Blood Pressure: 131 mmHg     Is BP treated: No     HDL Cholesterol: 55 mg/dL     Total Cholesterol: 150 mg/dL  - Lipid Panel  Dietary sodium restriction. Regular aerobic exercise. Check blood pressures daily and record.    The patient was given clear instructions to go to ER or return to medical center if symptoms do not improve, worsen or new problems develop. The patient verbalized understanding. Will notify patient with laboratory results.   Nolon Nations  MSN, FNP-C Patient Care [facility] [address] [place], [place] [address] [phone]

## 2017-05-28 LAB — COMPLETE METABOLIC PANEL WITH GFR
AG RATIO: 1.7 (calc) (ref 1.0–2.5)
ALKALINE PHOSPHATASE (APISO): 77 U/L (ref 40–115)
ALT: 30 U/L (ref 9–46)
AST: 22 U/L (ref 10–40)
Albumin: 4.3 g/dL (ref 3.6–5.1)
BILIRUBIN TOTAL: 0.4 mg/dL (ref 0.2–1.2)
BUN: 12 mg/dL (ref 7–25)
CHLORIDE: 109 mmol/L (ref 98–110)
CO2: 24 mmol/L (ref 20–32)
Calcium: 9.1 mg/dL (ref 8.6–10.3)
Creat: 0.79 mg/dL (ref 0.60–1.35)
GFR, EST NON AFRICAN AMERICAN: 112 mL/min/{1.73_m2} (ref >=60)
GFR, Est African American: 130 mL/min/{1.73_m2} (ref >=60)
GLOBULIN: 2.5 g/dL (ref 1.9–3.7)
Glucose, Bld: 83 mg/dL (ref 65–99)
POTASSIUM: 4.4 mmol/L (ref 3.5–5.3)
SODIUM: 140 mmol/L (ref 135–146)
Total Protein: 6.8 g/dL (ref 6.1–8.1)

## 2017-05-28 LAB — LIPID PANEL
CHOL/HDL RATIO: 2.7 (calc) (ref <5.0)
CHOLESTEROL: 150 mg/dL (ref <200)
HDL: 55 mg/dL (ref >40)
LDL CHOLESTEROL (CALC): 80 mg/dL
Non-HDL Cholesterol (Calc): 95 mg/dL (calc) (ref <130)
TRIGLYCERIDES: 74 mg/dL (ref <150)

## 2017-05-28 LAB — TSH: TSH: 0.62 m[IU]/L (ref 0.40–4.50)

## 2017-05-28 LAB — TIQ-NTM

## 2017-05-28 LAB — TROPONIN I

## 2017-05-30 DIAGNOSIS — Z13220 Encounter for screening for lipoid disorders: Secondary | ICD-10-CM | POA: Insufficient documentation

## 2017-05-30 DIAGNOSIS — R030 Elevated blood-pressure reading, without diagnosis of hypertension: Secondary | ICD-10-CM | POA: Insufficient documentation

## 2017-05-30 DIAGNOSIS — R079 Chest pain, unspecified: Secondary | ICD-10-CM | POA: Insufficient documentation

## 2017-05-30 DIAGNOSIS — I10 Essential (primary) hypertension: Secondary | ICD-10-CM | POA: Insufficient documentation

## 2017-05-30 NOTE — Patient Instructions (Signed)
Nonspecific Chest Pain Chest pain can be caused by many different conditions. There is a chance that your pain could be related to something serious, such as a heart attack or a blood clot in your lungs. Chest pain can also be caused by conditions that are not life-threatening. If you have chest pain, it is very important to follow up with your doctor. Follow these instructions at home: Medicines  If you were prescribed an antibiotic medicine, take it as told by your doctor. Do not stop taking the antibiotic even if you start to feel better.  Take over-the-counter and prescription medicines only as told by your doctor. Lifestyle  Do not use any products that contain nicotine or tobacco, such as cigarettes and e-cigarettes. If you need help quitting, ask your doctor.  Do not drink alcohol.  Make lifestyle changes as told by your doctor. These may include: ? Getting regular exercise. Ask your doctor for some activities that are safe for you. ? Eating a heart-healthy diet. A diet specialist (dietitian) can help you to learn healthy eating options. ? Staying at a healthy weight. ? Managing diabetes, if needed. ? Lowering your stress, as with deep breathing or spending time in nature. General instructions  Avoid any activities that make you feel chest pain.  If your chest pain is because of heartburn: ? Raise (elevate) the head of your bed about 6 inches (15 cm). You can do this by putting blocks under the bed legs at the head of the bed. ? Do not sleep with extra pillows under your head. That does not help heartburn.  Keep all follow-up visits as told by your doctor. This is important. This includes any further testing if your chest pain does not go away. Contact a doctor if:  Your chest pain does not go away.  You have a rash with blisters on your chest.  You have a fever.  You have chills. Get help right away if:  Your chest pain is worse.  You have a cough that gets worse, or  you cough up blood.  You have very bad (severe) pain in your belly (abdomen).  You are very weak.  You pass out (faint).  You have either of these for no clear reason: ? Sudden chest discomfort. ? Sudden discomfort in your arms, back, neck, or jaw.  You have shortness of breath at any time.  You suddenly start to sweat, or your skin gets clammy.  You feel sick to your stomach (nauseous).  You throw up (vomit).  You suddenly feel light-headed or dizzy.  Your heart starts to beat fast, or it feels like it is skipping beats. These symptoms may be an emergency. Do not wait to see if the symptoms will go away. Get medical help right away. Call your local emergency services (911 in the [place] Do not drive yourself to the hospital. This information is not intended to replace advice given to you by your health care provider. Make sure you discuss any questions you have with your health care provider. Document Released: 02/10/2008 Document Revised: 05/18/2016 Document Reviewed: 05/18/2016 Elsevier Interactive Patient Education  2017 [employer] Hypertension Hypertension is another name for high blood pressure. High blood pressure forces your heart to work harder to pump blood. This can cause problems over time. There are two numbers in a blood pressure reading. There is a top number (systolic) over a bottom number (diastolic). It is best to have a blood pressure below 120/80. Healthy choices can  help lower your blood pressure. You may need medicine to help lower your blood pressure if:  Your blood pressure cannot be lowered with healthy choices.  Your blood pressure is higher than 130/80.  Follow these instructions at home: Eating and drinking  If directed, follow the DASH eating plan. This diet includes: ? Filling half of your plate at each meal with fruits and vegetables. ? Filling one quarter of your plate at each meal with whole grains. Whole grains include whole wheat pasta,  brown rice, and whole grain bread. ? Eating or drinking low-fat dairy products, such as skim milk or low-fat yogurt. ? Filling one quarter of your plate at each meal with low-fat (lean) proteins. Low-fat proteins include fish, skinless chicken, eggs, beans, and tofu. ? Avoiding fatty meat, cured and processed meat, or chicken with skin. ? Avoiding premade or processed food.  Eat less than 1,500 mg of salt (sodium) a day.  Limit alcohol use to no more than 1 drink a day for nonpregnant women and 2 drinks a day for men. One drink equals 12 oz of beer, 5 oz of wine, or 1 oz of hard liquor. Lifestyle  Work with your doctor to stay at a healthy weight or to lose weight. Ask your doctor what the best weight is for you.  Get at least 30 minutes of exercise that causes your heart to beat faster (aerobic exercise) most days of the week. This may include walking, swimming, or biking.  Get at least 30 minutes of exercise that strengthens your muscles (resistance exercise) at least 3 days a week. This may include lifting weights or pilates.  Do not use any products that contain nicotine or tobacco. This includes cigarettes and e-cigarettes. If you need help quitting, ask your doctor.  Check your blood pressure at home as told by your doctor.  Keep all follow-up visits as told by your doctor. This is important. Medicines  Take over-the-counter and prescription medicines only as told by your doctor. Follow directions carefully.  Do not skip doses of blood pressure medicine. The medicine does not work as well if you skip doses. Skipping doses also puts you at risk for problems.  Ask your doctor about side effects or reactions to medicines that you should watch for. Contact a doctor if:  You think you are having a reaction to the medicine you are taking.  You have headaches that keep coming back (recurring).  You feel dizzy.  You have swelling in your ankles.  You have trouble with your  vision. Get help right away if:  You get a very bad headache.  You start to feel confused.  You feel weak or numb.  You feel faint.  You get very bad pain in your: ? Chest. ? Belly (abdomen).  You throw up (vomit) more than once.  You have trouble breathing. Summary  Hypertension is another name for high blood pressure.  Making healthy choices can help lower blood pressure. If your blood pressure cannot be controlled with healthy choices, you may need to take medicine. This information is not intended to replace advice given to you by your health care provider. Make sure you discuss any questions you have with your health care provider. Document Released: 02/10/2008 Document Revised: 07/22/2016 Document Reviewed: 07/22/2016 Elsevier Interactive Patient Education  [employer].

## 2017-06-08 ENCOUNTER — Telehealth: Payer: Self-pay

## 2017-06-08 NOTE — Telephone Encounter (Signed)
Letter received from [facility].   They offer a case management program to assist those with complex medical needs and have been unsuccessful trying to reach the patient. There is no additional cost for the service. They would like him to contact the [facility] # [phone] to discuss this program.    They are requesting that the PCP let him know this service is available.   I called and left a message for him today requesting a call back.    They are also requesting that the most recent clinical notes faxed to # [phone].

## 2017-06-12 ENCOUNTER — Emergency Department (HOSPITAL_COMMUNITY)
Admission: EM | Admit: 2017-06-12 | Discharge: 2017-06-12 | Disposition: A | Discharge status: Home / Self Care | Payer: Managed Care, Other (non HMO) | Attending: Emergency Medicine | Admitting: Emergency Medicine

## 2017-06-12 ENCOUNTER — Encounter (HOSPITAL_COMMUNITY): Payer: Self-pay

## 2017-06-12 ENCOUNTER — Emergency Department (HOSPITAL_COMMUNITY): Payer: Managed Care, Other (non HMO)

## 2017-06-12 DIAGNOSIS — M25551 Pain in right hip: Secondary | ICD-10-CM | POA: Insufficient documentation

## 2017-06-12 DIAGNOSIS — F17200 Nicotine dependence, unspecified, uncomplicated: Secondary | ICD-10-CM | POA: Insufficient documentation

## 2017-06-12 DIAGNOSIS — E119 Type 2 diabetes mellitus without complications: Secondary | ICD-10-CM | POA: Insufficient documentation

## 2017-06-12 NOTE — ED Provider Notes (Signed)
[facility]-EMERGENCY DEPT Provider Note   CSN: [id number] Arrival date & time: 06/12/17  0715     History   Chief Complaint Chief Complaint  Patient presents with  . Hip Pain    HPI Devon Pace is a 41 y.o. male who presents with right hip pain. He states that when he was around 15 he remembers playing kickball and his right hip had an acute onset of pain and he fell to the ground. He never had his hip evaluated but he has had intermittent pain in the area since then. Yesterday he was walking when he had an acute onset of pain over the lateral aspect of the right hip. He fell to the ground because of the pain. He went home and rested. This morning when he stood up he felt the pain when he put pressure on the leg again so decided to come to the ED to be checked out. He reports pain is worse with walking and pressure on the area. He denies back, knee, groin pain.   HPI  Past Medical History:  Diagnosis Date  . Diabetes mellitus without complication [facility])     Patient Active Problem List   Diagnosis Date Noted  . Chest pain 05/30/2017  . Elevated blood pressure reading 05/30/2017  . Screening cholesterol level 05/30/2017  . Tobacco dependence 11/16/2016  . Abnormal TSH 11/16/2016    History reviewed. No pertinent surgical history.     Home Medications    Prior to Admission medications   Medication Sig Start Date End Date Taking? Authorizing Provider  butalbital-acetaminophen-caffeine (FIORICET, ESGIC) [phone] MG tablet Take 1 tablet by mouth 2 (two) times daily as needed for headache. 02/15/17   Massie Maroon, FNP  ibuprofen (ADVIL,MOTRIN) 600 MG tablet Take 1 tablet (600 mg total) by mouth every 6 (six) hours as needed. 03/05/17   Fayrene Helper, PA-C  meclizine (ANTIVERT) 12.5 MG tablet Take 1 tablet (12.5 mg total) by mouth 3 (three) times daily as needed for dizziness. 03/29/17   Massie Maroon, FNP    Family History Family History  Problem Relation Age of Onset  .  Diabetes Mother   . Diabetes Maternal Grandmother     Social History Social History  Substance Use Topics  . Smoking status: Current Every Day Smoker    Packs/day: 0.25  . Smokeless tobacco: Never Used  . Alcohol use Yes     Comment: occ     Allergies   Patient has no known allergies.   Review of Systems Review of Systems  Musculoskeletal: Positive for arthralgias and gait problem. Negative for joint swelling.  Skin: Negative for wound.  Neurological: Negative for weakness and numbness.     Physical Exam Updated Vital Signs BP 117/74 (BP Location: Right Arm)   Pulse 69   Temp 97.6 F (36.4 C) (Oral)   Resp 20   Ht  (1.702 m)   Wt 68 kg (150 lb)   SpO2 100%   BMI 23.49 kg/m   Physical Exam  Constitutional: He is oriented to person, place, and time. He appears well-developed and well-nourished. No distress.  HENT:  Head: Normocephalic and atraumatic.  Eyes: Pupils are equal, round, and reactive to light. Conjunctivae are normal. Right eye exhibits no discharge. Left eye exhibits no discharge. No scleral icterus.  Neck: Normal range of motion.  Cardiovascular: Normal rate.   Pulmonary/Chest: Effort normal. No respiratory distress.  Abdominal: He exhibits no distension.  Musculoskeletal:  Right hip: No obvious swelling,  deformity, or warmth. Tenderness to palpation of lateral aspect of hip over greater trochanter. FROM. Ambulatory  Neurological: He is alert and oriented to person, place, and time.  Skin: Skin is warm and dry.  Psychiatric: He has a normal mood and affect. His behavior is normal.  Nursing note and vitals reviewed.    ED Treatments / Results  Labs (all labs ordered are listed, but only abnormal results are displayed) Labs Reviewed - No data to display  EKG  EKG Interpretation None       Radiology No results found.  Procedures Procedures (including critical care time)  Medications Ordered in ED Medications - No data to  display   Initial Impression / Assessment and Plan / ED Course  I have reviewed the triage vital signs and the nursing notes.  Pertinent labs & imaging results that were available during my care of the patient were reviewed by me and considered in my medical decision making (see chart for details).  41 year old male presents with right hip pain. Likely greater trochanter bursitis vs pain syndrome.  Xray is negative. Advised scheduled NSAIDs and Tylenol. Apply warm or cool compresses. F/u with PCP.  Final Clinical Impressions(s) / ED Diagnoses   Final diagnoses:  Right hip pain    New Prescriptions New Prescriptions   No medications on file     Bethel Born, PA-C 06/12/17 [id number]    Shaune Pollack, MD 06/13/17 [phone]

## 2017-06-12 NOTE — ED Notes (Signed)
Provider at the bedside.  

## 2017-06-12 NOTE — Discharge Instructions (Signed)
Take Ibuprofen  three times a day. Alternate with Tylenol  three times a day Apply cool compress or heating pad to area for 20 minutes at a time, several times a day Follow up with your doctor

## 2017-06-12 NOTE — ED Notes (Signed)
Pt taken to Xray.

## 2017-06-12 NOTE — ED Triage Notes (Signed)
Per Pt, Pt is coming from home with complaints of right hip pain. Reports that he had an episode where he was younger where he felt like his right hip "went bone and bone all different directions." Then yesterday, while he was at home, he got a sharp pain in his right hip that he felt like would go away. Woke up this morning and the pain hit him again.

## 2017-06-14 ENCOUNTER — Encounter: Payer: Self-pay | Admitting: Family Medicine

## 2017-06-14 ENCOUNTER — Ambulatory Visit (INDEPENDENT_AMBULATORY_CARE_PROVIDER_SITE_OTHER): Payer: Managed Care, Other (non HMO) | Admitting: Family Medicine

## 2017-06-14 VITALS — BP 131/71 | HR 60 | Temp 97.5°F | Resp 16 | Ht 67.0 in | Wt 159.0 lb

## 2017-06-14 DIAGNOSIS — M25551 Pain in right hip: Secondary | ICD-10-CM

## 2017-06-14 MED ORDER — GABAPENTIN 300 MG PO CAPS: 300.0000 mg | ORAL_CAPSULE | Freq: Every day | ORAL | 0 refills | Status: DC

## 2017-06-14 MED ORDER — IBUPROFEN 800 MG PO TABS: 800.0000 mg | ORAL_TABLET | Freq: Four times a day (QID) | ORAL | 0 refills | Status: DC | PRN

## 2017-06-14 MED ORDER — KETOROLAC TROMETHAMINE 60 MG/2ML IM SOLN
60.0000 mg | Freq: Once | INTRAMUSCULAR | Status: AC
  Administered 2017-06-14: 60 mg via INTRAMUSCULAR

## 2017-06-14 NOTE — Patient Instructions (Signed)
Acute pain of right hip:   Gabapentin 300 mg at bedtime. Do not drink, drive, or operate machinery while taking this medication.   Ibuprofen 800 mg three times per day with food.   Apply warm, moist compresses to right hip 20 minutes 4 times per day as needed, use interchangeably with cool compresses for 20 minutes 4 times per day.  Hip Pain The hip is the joint between the upper legs and the lower pelvis. The bones, cartilage, tendons, and muscles of your hip joint support your body and allow you to move around. Hip pain can range from a minor ache to severe pain in one or both of your hips. The pain may be felt on the inside of the hip joint near the groin, or the outside near the buttocks and upper thigh. You may also have swelling or stiffness. Follow these instructions at home: Managing pain, stiffness, and swelling  If directed, apply ice to the injured area. ? Put ice in a plastic bag. ? Place a towel between your skin and the bag. ? Leave the ice on for 20 minutes, 2-3 times a day  Sleep with a pillow between your legs on your most comfortable side.  Avoid any activities that cause pain. General instructions  Take over-the-counter and prescription medicines only as told by your health care provider.  Do any exercises as told by your health care provider.  Record the following: ? How often you have hip pain. ? The location of your pain. ? What the pain feels like. ? What makes the pain worse.  Keep all follow-up visits as told by your health care provider. This is important. Contact a health care provider if:  You cannot put weight on your leg.  Your pain or swelling continues or gets worse after one week.  It gets harder to walk.  You have a fever. Get help right away if:  You fall.  You have a sudden increase in pain and swelling in your hip.  Your hip is red or swollen or very tender to touch. Summary  Hip pain can range from a minor ache to severe pain  in one or both of your hips.  The pain may be felt on the inside of the hip joint near the groin, or the outside near the buttocks and upper thigh.  Avoid any activities that cause pain.  Record how often you have hip pain, the location of the pain, what makes it worse and what it feels like. This information is not intended to replace advice given to you by your health care provider. Make sure you discuss any questions you have with your health care provider. Document Released: 02/11/2010 Document Revised: 07/27/2016 Document Reviewed: 07/27/2016 Elsevier Interactive Patient Education  2017 [employer].

## 2017-06-14 NOTE — Progress Notes (Signed)
  Devon Pace, a 40 year old male presents complaining of right hip pain. Onset of the symptoms was a week ago. Patient has not identified any inciting events. The patient reports the hip pain is worse with weight bearing, is aggravated by walking and radiates to right ankle. Patient describes pain as intermittent and is alleviated by rest. Current pain intensity is 8/10. Aggravating symptoms include: any weight bearing, going up and down stairs, rising after sitting and squatting. Patient has had no prior hip problems. Patient last had Tylenol 500 mg around 8 pm without sustained relief. Patient was evaluated in the emergency room on 06/12/2017. Right hip xray performed.   Past Medical History:  Diagnosis Date  . Diabetes mellitus without complication [facility])    Social History   Social History  . Marital status: Single    Spouse name: N/A  . Number of children: N/A  . Years of education: N/A   Occupational History  . Not on file.   Social History Main Topics  . Smoking status: Current Every Day Smoker    Packs/day: 0.25  . Smokeless tobacco: Never Used  . Alcohol use Yes     Comment: occ  . Drug use: No  . Sexual activity: Not on file   Other Topics Concern  . Not on file   Social History Narrative  . No narrative on file   Immunization History  Administered Date(s) Administered  . Pneumococcal Polysaccharide-23 08/18/2016  . Tdap 11/16/2016   Review of Systems  Constitutional: Negative.   HENT: Negative.   Eyes: Negative.   Respiratory: Negative.   Cardiovascular: Negative.   Gastrointestinal: Negative.   Musculoskeletal: Positive for joint pain.       Right hip pain  Skin: Negative.   Neurological: Negative.   Endo/Heme/Allergies: Negative.   Psychiatric/Behavioral: Negative.   Physical Exam  HENT:  Head: Normocephalic and atraumatic.  Right Ear: External ear normal.  Left Ear: External ear normal.  Nose: Nose normal.  Mouth/Throat: Oropharynx is clear and  moist.  Eyes: Pupils are equal, round, and reactive to light. Conjunctivae and EOM are normal.  Neck: Normal range of motion. Neck supple.  Pulmonary/Chest: Effort normal and breath sounds normal.  Musculoskeletal:       Right hip: He exhibits decreased range of motion, decreased strength and tenderness.  Neurological: He is alert. Gait normal.  Plan   1. Acute pain of right hip Recommend warm compresses 20 minutes 4 times per day, use interchangeably with cool compresses.  Reviewed xray, which yielded the following results:   FINDINGS: No fracture or bone lesion.  The hip joints are normally spaced and aligned. No arthropathic change. The SI joints and symphysis pubis are normally spaced and aligned.  Normal soft tissues.  IMPRESSION: Negative.  Plan:  - ketorolac (TORADOL) injection 60 mg; Inject 2 mLs (60 mg total) into the muscle once. - ibuprofen (ADVIL,MOTRIN) 800 MG tablet; Take 1 tablet (800 mg total) by mouth every 6 (six) hours as needed.  Dispense: 30 tablet; Refill: 0 - gabapentin (NEURONTIN) 300 MG capsule; Take 1 capsule (300 mg total) by mouth at bedtime.  Dispense: 30 capsule; Refill: 0   Nolon Nations  MSN, FNP-C [facility] [address] [place], [place] [address] [phone]

## 2017-06-30 ENCOUNTER — Ambulatory Visit (INDEPENDENT_AMBULATORY_CARE_PROVIDER_SITE_OTHER): Payer: Managed Care, Other (non HMO) | Admitting: Family Medicine

## 2017-06-30 ENCOUNTER — Encounter: Payer: Self-pay | Admitting: Family Medicine

## 2017-06-30 VITALS — BP 137/75 | HR 61 | Temp 98.2°F | Resp 14 | Ht 67.0 in | Wt 166.0 lb

## 2017-06-30 DIAGNOSIS — R5383 Other fatigue: Secondary | ICD-10-CM

## 2017-06-30 DIAGNOSIS — F3289 Other specified depressive episodes: Secondary | ICD-10-CM | POA: Diagnosis not present

## 2017-06-30 LAB — POCT GLYCOSYLATED HEMOGLOBIN (HGB A1C): HEMOGLOBIN A1C: 5.2

## 2017-06-30 LAB — BASIC METABOLIC PANEL WITH GFR
BUN: 10 mg/dL (ref 7–25)
CO2: 25 mmol/L (ref 20–32)
CREATININE: 0.66 mg/dL (ref 0.60–1.35)
Calcium: 8.6 mg/dL (ref 8.6–10.3)
Chloride: 108 mmol/L (ref 98–110)
GFR, EST NON AFRICAN AMERICAN: 121 mL/min/{1.73_m2} (ref >=60)
GFR, Est African American: 140 mL/min/{1.73_m2} (ref >=60)
GLUCOSE: 139 mg/dL — AB (ref 65–99)
Potassium: 3.9 mmol/L (ref 3.5–5.3)
SODIUM: 139 mmol/L (ref 135–146)

## 2017-06-30 LAB — CBC
HEMATOCRIT: 38 % — AB (ref 38.5–50.0)
HEMOGLOBIN: 11.6 g/dL — AB (ref 13.2–17.1)
MCH: 22.7 pg — ABNORMAL LOW (ref 27.0–33.0)
MCHC: 30.5 g/dL — AB (ref 32.0–36.0)
MCV: 74.2 fL — ABNORMAL LOW (ref 80.0–100.0)
MPV: 11.3 fL (ref 7.5–12.5)
Platelets: 271 10*3/uL (ref 140–400)
RBC: 5.12 10*6/uL (ref 4.20–5.80)
RDW: 12.1 % (ref 11.0–15.0)
WBC: 6.1 10*3/uL (ref 3.8–10.8)

## 2017-06-30 LAB — GLUCOSE, CAPILLARY: GLUCOSE-CAPILLARY: 149 mg/dL — AB (ref 65–99)

## 2017-06-30 NOTE — Patient Instructions (Signed)
Increase rest, fluid intake, and handwashing. Will follow up by phone with any abnormal laboratory results.   Fatigue Fatigue is feeling tired all of the time, a lack of energy, or a lack of motivation. Occasional or mild fatigue is often a normal response to activity or life in general. However, long-lasting (chronic) or extreme fatigue may indicate an underlying medical condition. Follow these instructions at home: Watch your fatigue for any changes. The following actions may help to lessen any discomfort you are feeling:  Talk to your health care provider about how much sleep you need each night. Try to get the required amount every night.  Take medicines only as directed by your health care provider.  Eat a healthy and nutritious diet. Ask your health care provider if you need help changing your diet.  Drink enough fluid to keep your urine clear or pale yellow.  Practice ways of relaxing, such as yoga, meditation, massage therapy, or acupuncture.  Exercise regularly.  Change situations that cause you stress. Try to keep your work and personal routine reasonable.  Do not abuse illegal drugs.  Limit alcohol intake to no more than 1 drink per day for nonpregnant women and 2 drinks per day for men. One drink equals 12 ounces of beer, 5 ounces of wine, or 1 ounces of hard liquor.  Take a multivitamin, if directed by your health care provider.  Contact a health care provider if:  Your fatigue does not get better.  You have a fever.  You have unintentional weight loss or gain.  You have headaches.  You have difficulty: ? Falling asleep. ? Sleeping throughout the night.  You feel angry, guilty, anxious, or sad.  You are unable to have a bowel movement (constipation).  You skin is dry.  Your legs or another part of your body is swollen. Get help right away if:  You feel confused.  Your vision is blurry.  You feel faint or pass out.  You have a severe  headache.  You have severe abdominal, pelvic, or back pain.  You have chest pain, shortness of breath, or an irregular or fast heartbeat.  You are unable to urinate or you urinate less than normal.  You develop abnormal bleeding, such as bleeding from the rectum, vagina, nose, lungs, or nipples.  You vomit blood.  You have thoughts about harming yourself or committing suicide.  You are worried that you might harm someone else. This information is not intended to replace advice given to you by your health care provider. Make sure you discuss any questions you have with your health care provider. Document Released: 06/21/2007 Document Revised: 01/30/2016 Document Reviewed: 12/26/2013 Elsevier Interactive Patient Education  [employer]2018 Else[employer] Inc[employer]

## 2017-06-30 NOTE — Progress Notes (Signed)
Subjective:     Devon Pace is a 41 y.o. male who presents for evaluation of fatigue. Symptoms began several days ago. The patient feels the fatigue began with: feeling tired. Symptoms of his fatigue have been diffuse soft tissue aches and pains, general malaise and lack of interest in usual activities. Patient describes the following psychological symptoms: depression. Patient denies cold intolerance, constipation, excessive menstrual bleeding, exercise intolerance, GI blood loss, significant change in weight, symptoms of arthritis and unusual rashes. Symptoms have been intermittent. Symptom severity: symptoms bothersome, but easily able to carry out all usual work/school/family activities.   Past Medical History:  Diagnosis Date  . Diabetes mellitus without complication (HCC)   Review of Systems  Constitutional: Positive for malaise/fatigue.  HENT: Negative.   Eyes: Negative.   Respiratory: Negative.   Cardiovascular: Negative.  Negative for chest pain and palpitations.  Gastrointestinal: Negative.  Negative for constipation and diarrhea.  Genitourinary: Negative.   Musculoskeletal: Negative.   Skin: Negative.   Neurological: Negative.  Negative for weakness.  Psychiatric/Behavioral: Negative.     Objective:    BP 137/75 (BP Location: Right Arm, Patient Position: Sitting, Cuff Size: Normal)   Pulse 61   Temp 98.2 F (36.8 C) (Oral)   Resp 14   Ht 5\' 7"  (1.702 m)   Wt 166 lb (75.3 kg)   SpO2 100%   BMI 26.00 kg/m   General Appearance:    Alert, cooperative, mild distress, appears stated age  Head:    Normocephalic, without obvious abnormality, atraumatic  Eyes:    PERRL, conjunctiva/corneas clear, EOM's intact, fundi    benign, both eyes       Ears:    Normal TM's and external ear canals, both ears  Nose:   Nares normal, septum midline, mucosa normal, no drainage    or sinus tenderness  Throat:   Lips, mucosa, and tongue normal; teeth and gums normal  Neck:   Supple,  symmetrical, trachea midline, no adenopathy;       thyroid:  No enlargement/tenderness/nodules; no carotid   bruit or JVD  Back:     Symmetric, no curvature, ROM normal, no CVA tenderness  Lungs:     Clear to auscultation bilaterally, respirations unlabored  Chest wall:    No tenderness or deformity  Heart:    Regular rate and rhythm, S1 and S2 normal, no murmur, rub   or gallop  Abdomen:     Soft, non-tender, bowel sounds active all four quadrants,    no masses, no organomegaly  Extremities:   Extremities normal, atraumatic, no cyanosis or edema  Pulses:   2+ and symmetric all extremities  Skin:   Skin color, texture, turgor normal, no rashes or lesions  Lymph nodes:   Cervical, supraclavicular, and axillary nodes normal  Neurologic:   CNII-XII intact. Normal strength, sensation and reflexes      throughout      Assessment:    Fatigue, organic etiology unlikely based on careful history and exam. Depression is likely contributing to the problem.    Plan:  1. Other fatigue - HgB A1c - Glucose (CBG) - CBC - BASIC METABOLIC PANEL WITH GFR  2. Other depression Patient is not interested in starting anti depression   Discussed diagnosis with patient. Reassured that serious underlying cause for the fatigue is very unlikely.      RTC: Will follow up by phone with any abnormal lab results   Nolon NationsLaChina Moore Judia Arnott  MSN, FNP-C [facility] [address] [place], [place] [address] [phone]   The patient was given clear instructions to go to ER or return to medical center if symptoms do not improve, worsen or new problems develop. The patient verbalized understanding. Will notify patient with laboratory results.

## 2017-07-01 ENCOUNTER — Other Ambulatory Visit: Payer: Self-pay | Admitting: Family Medicine

## 2017-07-01 ENCOUNTER — Telehealth: Payer: Self-pay

## 2017-07-01 DIAGNOSIS — D508 Other iron deficiency anemias: Secondary | ICD-10-CM

## 2017-07-01 LAB — POCT URINALYSIS DIP (DEVICE)
Bilirubin Urine: NEGATIVE
GLUCOSE, UA: NEGATIVE mg/dL
Hgb urine dipstick: NEGATIVE
Ketones, ur: NEGATIVE mg/dL
LEUKOCYTES UA: NEGATIVE
NITRITE: NEGATIVE
PROTEIN: NEGATIVE mg/dL
Specific Gravity, Urine: 1.02 (ref 1.005–1.030)
UROBILINOGEN UA: 1 mg/dL (ref 0.0–1.0)
pH: 6.5 (ref 5.0–8.0)

## 2017-07-01 MED ORDER — FERROUS SULFATE 325 (65 FE) MG PO TABS: 325.0000 mg | ORAL_TABLET | Freq: Every day | ORAL | 3 refills | Status: DC

## 2017-07-01 NOTE — Telephone Encounter (Signed)
Patient returned call and I advised of mild anemia. Advised to start ferrous sulfate 325mg  once daily and to increase daily intake of iron rich foods such as green leafy veggies, beans, and organ meats. Patient verbalized understanding.

## 2017-07-01 NOTE — Telephone Encounter (Signed)
-----   Message from Massie MaroonLachina M Hollis, [place]NP sent at 07/01/2017  5:21 AM EDT ----- Regarding: lab results Please inform Devon Pace that labs are consistent with mild anemia. Will start ferrous sulfate 325 mg daily. Also, recommend an iron rich diet (green, leafy veggies, beans, and organ meats).   Thanks

## 2017-07-01 NOTE — Progress Notes (Signed)
Meds ordered this encounter  Medications  . ferrous sulfate 325 (65 FE) MG tablet    Sig: Take 1 tablet (325 mg total) by mouth daily.    Dispense:  30 tablet    Refill:  3    LaChina Rennis PettyMoore Hollis  MSN, FNP-C [facility] [address]  [place], [place] [address] [phone]

## 2017-07-01 NOTE — Telephone Encounter (Signed)
Called, no answer. Left a message for patient to return call. Thanks!  

## 2017-08-01 ENCOUNTER — Emergency Department (HOSPITAL_COMMUNITY)
Admission: EM | Admit: 2017-08-01 | Discharge: 2017-08-01 | Disposition: A | Discharge status: Home / Self Care | Payer: Managed Care, Other (non HMO) | Attending: Emergency Medicine | Admitting: Emergency Medicine

## 2017-08-01 ENCOUNTER — Encounter (HOSPITAL_COMMUNITY): Payer: Self-pay | Admitting: *Deleted

## 2017-08-01 ENCOUNTER — Emergency Department (HOSPITAL_COMMUNITY): Payer: Managed Care, Other (non HMO)

## 2017-08-01 DIAGNOSIS — Z79899 Other long term (current) drug therapy: Secondary | ICD-10-CM | POA: Diagnosis not present

## 2017-08-01 DIAGNOSIS — F17200 Nicotine dependence, unspecified, uncomplicated: Secondary | ICD-10-CM | POA: Insufficient documentation

## 2017-08-01 DIAGNOSIS — E119 Type 2 diabetes mellitus without complications: Secondary | ICD-10-CM | POA: Insufficient documentation

## 2017-08-01 DIAGNOSIS — J0110 Acute frontal sinusitis, unspecified: Secondary | ICD-10-CM

## 2017-08-01 DIAGNOSIS — R0981 Nasal congestion: Secondary | ICD-10-CM | POA: Diagnosis present

## 2017-08-01 DIAGNOSIS — J019 Acute sinusitis, unspecified: Secondary | ICD-10-CM | POA: Diagnosis not present

## 2017-08-01 LAB — CBG MONITORING, ED: GLUCOSE-CAPILLARY: 190 mg/dL — AB (ref 65–99)

## 2017-08-01 MED ORDER — IBUPROFEN 400 MG PO TABS
600.0000 mg | ORAL_TABLET | Freq: Once | ORAL | Status: AC
  Administered 2017-08-01: 18:00:00 600 mg via ORAL
  Filled 2017-08-01: qty 1

## 2017-08-01 MED ORDER — LORATADINE 10 MG PO TABS: 10.0000 mg | ORAL_TABLET | Freq: Every day | ORAL | 0 refills | Status: DC

## 2017-08-01 MED ORDER — OXYMETAZOLINE HCL 0.05 % NA SOLN: 2.0000 | Freq: Two times a day (BID) | NASAL | 0 refills | Status: AC

## 2017-08-01 MED ORDER — FLUTICASONE PROPIONATE 50 MCG/ACT NA SUSP: 2.0000 | Freq: Every day | NASAL | 0 refills | Status: DC

## 2017-08-01 NOTE — ED Triage Notes (Signed)
Pt reports having the flu. Reports bodyaches, sweats, headache, mild diarrhea, mild sore throat x 2 days. Mask on pt at triage.

## 2017-08-01 NOTE — ED Provider Notes (Signed)
[facility] Provider Note   CSN: [id number] Arrival date & time: 08/01/17  1650     History   Chief Complaint Chief Complaint  Patient presents with  . Influenza    HPI Devon Pace is a 41 y.o. male.  HPI Patient presents with 3-4 days of nasal congestion, frontal headache, subjective fevers and chills, sore throat, cough with sputum production.  Denies chest pain or shortness of breath.  No neck pain or stiffness.  No known sick contacts.  Patient does have some myalgias.  He's had a few loose stools but denies nausea or vomiting. Past Medical History:  Diagnosis Date  . Diabetes mellitus without complication [facility](HCC)     Patient Active Problem List   Diagnosis Date Noted  . Chest pain 05/30/2017  . Elevated blood pressure reading 05/30/2017  . Screening cholesterol level 05/30/2017  . Tobacco dependence 11/16/2016  . Abnormal TSH 11/16/2016    History reviewed. No pertinent surgical history.     Home Medications    Prior to Admission medications   Medication Sig Start Date End Date Taking? Authorizing Provider  butalbital-acetaminophen-caffeine (FIORICET, ESGIC) [id number] MG tablet Take 1 tablet by mouth 2 (two) times daily as needed for headache. 02/15/17   Massie MaroonHollis, Lachina M, FNP  ferrous sulfate 325 (65 FE) MG tablet Take 1 tablet (325 mg total) by mouth daily. 07/01/17 07/01/18  Massie MaroonHollis, Lachina M, FNP  fluticasone (FLONASE) 50 MCG/ACT nasal spray Place 2 sprays into both nostrils daily. 08/01/17   Loren RacerYelverton, Tegan Burnside, MD  gabapentin (NEURONTIN) 300 MG capsule Take 1 capsule (300 mg total) by mouth at bedtime. 06/14/17   Massie MaroonHollis, Lachina M, FNP  ibuprofen (ADVIL,MOTRIN) 800 MG tablet Take 1 tablet (800 mg total) by mouth every 6 (six) hours as needed. 06/14/17   Massie MaroonHollis, Lachina M, FNP  loratadine (CLARITIN) 10 MG tablet Take 1 tablet (10 mg total) by mouth daily. 08/01/17   Loren RacerYelverton, Treniyah Lynn, MD  meclizine (ANTIVERT) 12.5 MG tablet  Take 1 tablet (12.5 mg total) by mouth 3 (three) times daily as needed for dizziness. 03/29/17   Massie MaroonHollis, Lachina M, FNP  oxymetazoline (AFRIN NASAL SPRAY) 0.05 % nasal spray Place 2 sprays into both nostrils 2 (two) times daily for 3 days. 08/01/17 08/04/17  Loren RacerYelverton, Shirley Bolle, MD    Family History Family History  Problem Relation Age of Onset  . Diabetes Mother   . Diabetes Maternal Grandmother     Social History Social History   Tobacco Use  . Smoking status: Current Every Day Smoker    Packs/day: 0.25  . Smokeless tobacco: Never Used  Substance Use Topics  . Alcohol use: Yes    Comment: occ  . Drug use: No     Allergies   Patient has no known allergies.   Review of Systems Review of Systems  Constitutional: Positive for chills and fever.  HENT: Positive for congestion, sinus pressure, sinus pain and sore throat.   Eyes: Negative for visual disturbance.  Respiratory: Positive for cough. Negative for shortness of breath and wheezing.   Cardiovascular: Negative for chest pain.  Gastrointestinal: Negative for abdominal pain, diarrhea, nausea and vomiting.  Musculoskeletal: Positive for myalgias. Negative for back pain, neck pain and neck stiffness.  Skin: Negative for rash and wound.  Neurological: Positive for headaches. Negative for dizziness, weakness, light-headedness and numbness.  All other systems reviewed and are negative.    Physical Exam Updated Vital Signs BP (!) 144/78 (BP Location: Right Arm)  Pulse 77   Temp 98.4 F (36.9 C) (Oral)   Resp 18   SpO2 99%   Physical Exam  Constitutional: He is oriented to person, place, and time. He appears well-developed and well-nourished. No distress.  HENT:  Head: Normocephalic and atraumatic.  Mouth/Throat: Oropharynx is clear and moist.  Bilateral nasal mucosal edema.  Oropharynx is mildly erythematous.  Uvula is midline.  Tonsils without exudates.  Patient does have right frontal sinus tenderness to percussion.   Eyes: EOM are normal. Pupils are equal, round, and reactive to light.  Neck: Normal range of motion. Neck supple.  No meningismus.  Cardiovascular: Normal rate and regular rhythm. Exam reveals no gallop and no friction rub.  No murmur heard. Pulmonary/Chest: Effort normal and breath sounds normal. No stridor. No respiratory distress. He has no wheezes. He has no rales. He exhibits no tenderness.  Abdominal: Soft. Bowel sounds are normal. There is no tenderness. There is no rebound and no guarding.  Musculoskeletal: Normal range of motion. He exhibits no edema or tenderness.  No lower extremity swelling, asymmetry or tenderness.  Lymphadenopathy:    He has no cervical adenopathy.  Neurological: He is alert and oriented to person, place, and time.  Moves all extremities without focal deficit.  Sensation fully intact.  Skin: Skin is warm and dry. No rash noted. He is not diaphoretic. No erythema.  Psychiatric: He has a normal mood and affect. His behavior is normal.  Nursing note and vitals reviewed.    ED Treatments / Results  Labs (all labs ordered are listed, but only abnormal results are displayed) Labs Reviewed  CBG MONITORING, ED - Abnormal; Notable for the following components:      Result Value   Glucose-Capillary 190 (*)    All other components within normal limits    EKG  EKG Interpretation None       Radiology Dg Chest 2 View  Result Date: 08/01/2017 CLINICAL DATA:  41 year old male with flu-like symptoms for 2 days. EXAM: CHEST  2 VIEW COMPARISON:  None. FINDINGS: The heart size and mediastinal contours are within normal limits. Both lungs are clear. The visualized skeletal structures are unremarkable. IMPRESSION: No active cardiopulmonary disease. Electronically Signed   By: Sande BrothersSerena  Chacko M.D.   On: 08/01/2017 18:44    Procedures Procedures (including critical care time)  Medications Ordered in ED Medications  ibuprofen (ADVIL,MOTRIN) tablet 600 mg (600 mg  Oral Given 08/01/17 1756)     Initial Impression / Assessment and Plan / ED Course  I have reviewed the triage vital signs and the nursing notes.  Pertinent labs & imaging results that were available during my care of the patient were reviewed by me and considered in my medical decision making (see chart for details).     X-ray without acute findings.  Symptoms consistent with sinusitis.  Will treat symptomatically and advised follow-up with primary physician.  Return precautions given.  Final Clinical Impressions(s) / ED Diagnoses   Final diagnoses:  Acute frontal sinusitis, recurrence not specified    ED Discharge Orders        Ordered    loratadine (CLARITIN) 10 MG tablet  Daily     08/01/17 1859    fluticasone (FLONASE) 50 MCG/ACT nasal spray  Daily     08/01/17 1859    oxymetazoline (AFRIN NASAL SPRAY) 0.05 % nasal spray  2 times daily     08/01/17 1859       Loren RacerYelverton, Musa Rewerts, MD 08/01/17 1900

## 2017-08-26 ENCOUNTER — Ambulatory Visit (INDEPENDENT_AMBULATORY_CARE_PROVIDER_SITE_OTHER): Payer: Managed Care, Other (non HMO) | Admitting: Family Medicine

## 2017-08-26 ENCOUNTER — Encounter: Payer: Self-pay | Admitting: Family Medicine

## 2017-08-26 VITALS — BP 122/62 | HR 62 | Temp 98.0°F | Resp 14 | Ht 67.0 in | Wt 167.0 lb

## 2017-08-26 DIAGNOSIS — R7989 Other specified abnormal findings of blood chemistry: Secondary | ICD-10-CM

## 2017-08-26 DIAGNOSIS — F17200 Nicotine dependence, unspecified, uncomplicated: Secondary | ICD-10-CM

## 2017-08-26 DIAGNOSIS — R7303 Prediabetes: Secondary | ICD-10-CM | POA: Diagnosis not present

## 2017-08-26 LAB — POCT GLYCOSYLATED HEMOGLOBIN (HGB A1C): Hemoglobin A1C: 5.7

## 2017-08-26 NOTE — Patient Instructions (Signed)

## 2017-08-27 LAB — TSH: TSH: 0.551 u[IU]/mL (ref 0.450–4.500)

## 2017-08-31 ENCOUNTER — Encounter: Payer: Self-pay | Admitting: Family Medicine

## 2017-08-31 NOTE — Progress Notes (Signed)
Subjective:     Devon Pace is a 41 y.o. male who presents for follow-up of prediabetes and abnormal TSH.  Patient has a history of type 2 diabetes mellitus.  He was previously on metformin 500 mg with breakfast.  Patient is hemoglobin A1c has been at goal for greater than 6 months.  Metformin was discontinued previously due to hemoglobin A1c being consistently below 5.7.  Patient follows a carbohydrate modify diet but does not exercise routinely.  He does not check blood sugars at home.  Patient also had an abnormal TSH several months ago.  He was previously started on methimazole but was unable to tolerate medication due to side effects.  He endorses occasional fatigue but otherwise feels well.  Symptoms of his fatigue have been lack of interest in usual activities. Patient denies cold intolerance, constipation, excessive menstrual bleeding, exercise intolerance, GI blood loss, significant change in weight, symptoms of arthritis and unusual rashes.  Patient is also a chronic everyday tobacco user.  He states that he has decreased smoking and only smokes 5-10 cigarettes/day. No Known Allergies Immunization History  Administered Date(s) Administered  . Pneumococcal Polysaccharide-23 08/18/2016  . Tdap 11/16/2016    Social History   Socioeconomic History  . Marital status: Single    Spouse name: Not on file  . Number of children: Not on file  . Years of education: Not on file  . Highest education level: Not on file  Social Needs  . Financial resource strain: Not on file  . Food insecurity - worry: Not on file  . Food insecurity - inability: Not on file  . Transportation needs - medical: Not on file  . Transportation needs - non-medical: Not on file  Occupational History  . Not on file  Tobacco Use  . Smoking status: Current Every Day Smoker    Packs/day: 0.25  . Smokeless tobacco: Never Used  Substance and Sexual Activity  . Alcohol use: Yes    Comment: occ  . Drug use: No  . Sexual  activity: Not on file  Other Topics Concern  . Not on file  Social History Narrative  . Not on file    Past Medical History:  Diagnosis Date  . Prediabetes   . Tobacco dependence   Review of Systems  Constitutional: Positive for malaise/fatigue.  HENT: Negative.   Eyes: Negative.   Respiratory: Negative.   Cardiovascular: Negative.  Negative for chest pain and palpitations.  Gastrointestinal: Negative.  Negative for constipation and diarrhea.  Genitourinary: Negative.   Musculoskeletal: Negative.   Skin: Negative.   Neurological: Negative.  Negative for weakness.  Psychiatric/Behavioral: Negative.     Objective:    BP 122/62 (BP Location: Left Arm, Patient Position: Sitting, Cuff Size: Small)   Pulse 62   Temp 98 F (36.7 C) (Oral)   Resp 14   Ht 5\' 7"  (1.702 m)   Wt 167 lb (75.8 kg)   SpO2 99%   BMI 26.16 kg/m   General Appearance:    Alert, cooperative, mild distress, appears stated age  Head:    Normocephalic, without obvious abnormality, atraumatic  Eyes:    PERRL, conjunctiva/corneas clear, EOM's intact, fundi    benign, both eyes       Ears:    Normal TM's and external ear canals, both ears  Nose:   Nares normal, septum midline, mucosa normal, no drainage    or sinus tenderness  Throat:   Lips, mucosa, and tongue normal; teeth and gums normal  Neck:   Supple, symmetrical, trachea midline, no adenopathy;       thyroid:  No enlargement/tenderness/nodules; no carotid   bruit or JVD  Back:     Symmetric, no curvature, ROM normal, no CVA tenderness  Lungs:     Clear to auscultation bilaterally, respirations unlabored  Chest wall:    No tenderness or deformity  Heart:    Regular rate and rhythm, S1 and S2 normal, no murmur, rub   or gallop  Abdomen:     Soft, non-tender, bowel sounds active all four quadrants,    no masses, no organomegaly  Extremities:   Extremities normal, atraumatic, no cyanosis or edema  Pulses:   2+ and symmetric all extremities  Skin:    Skin color, texture, turgor normal, no rashes or lesions  Lymph nodes:   Cervical, supraclavicular, and axillary nodes normal  Neurologic:   CNII-XII intact. Normal strength, sensation and reflexes      throughout      Assessment:  BP 122/62 (BP Location: Left Arm, Patient Position: Sitting, Cuff Size: Small)   Pulse 62   Temp 98 F (36.7 C) (Oral)   Resp 14   Ht 5\' 7"  (1.702 m)   Wt 167 lb (75.8 kg)   SpO2 99%   BMI 26.16 kg/m    Plan:   Prediabetes Hemoglobin A1c is  5.7.  No addition of medications warranted on today.  Recommend the patient continue pain carbohydrate modify diet divided over 5-6 small meals.  Also recommend 150 minutes of cardiovascular exercise per week. - HgB A1c  Abnormal TSH We will follow-up by phone with any abnormal laboratory results - TSH  Tobacco dependence Smoking cessation instruction/counseling given:  counseled patient on the dangers of tobacco use, advised patient to stop smoking, and reviewed strategies to maximize success      return to clinic in 6 months for complete physical exam     Nolon NationsLachina Moore Emya Picado  MSN, FNP-C [facility] [address]  [place], [place] [address] [phone]

## 2017-09-07 ENCOUNTER — Encounter (HOSPITAL_COMMUNITY): Payer: Self-pay | Admitting: Nurse Practitioner

## 2017-09-07 ENCOUNTER — Emergency Department (HOSPITAL_COMMUNITY): Payer: Worker's Compensation

## 2017-09-07 ENCOUNTER — Other Ambulatory Visit: Payer: Self-pay

## 2017-09-07 ENCOUNTER — Emergency Department (HOSPITAL_COMMUNITY)
Admission: EM | Admit: 2017-09-07 | Discharge: 2017-09-07 | Disposition: A | Discharge status: Home / Self Care | Payer: Worker's Compensation | Attending: Emergency Medicine | Admitting: Emergency Medicine

## 2017-09-07 DIAGNOSIS — Z79899 Other long term (current) drug therapy: Secondary | ICD-10-CM | POA: Insufficient documentation

## 2017-09-07 DIAGNOSIS — Y9389 Activity, other specified: Secondary | ICD-10-CM | POA: Diagnosis not present

## 2017-09-07 DIAGNOSIS — W108XXA Fall (on) (from) other stairs and steps, initial encounter: Secondary | ICD-10-CM | POA: Diagnosis not present

## 2017-09-07 DIAGNOSIS — F17210 Nicotine dependence, cigarettes, uncomplicated: Secondary | ICD-10-CM | POA: Insufficient documentation

## 2017-09-07 DIAGNOSIS — Y999 Unspecified external cause status: Secondary | ICD-10-CM | POA: Insufficient documentation

## 2017-09-07 DIAGNOSIS — S46001A Unspecified injury of muscle(s) and tendon(s) of the rotator cuff of right shoulder, initial encounter: Secondary | ICD-10-CM | POA: Insufficient documentation

## 2017-09-07 DIAGNOSIS — Y929 Unspecified place or not applicable: Secondary | ICD-10-CM | POA: Insufficient documentation

## 2017-09-07 DIAGNOSIS — S4991XA Unspecified injury of right shoulder and upper arm, initial encounter: Secondary | ICD-10-CM | POA: Diagnosis present

## 2017-09-07 DIAGNOSIS — W19XXXA Unspecified fall, initial encounter: Secondary | ICD-10-CM

## 2017-09-07 MED ORDER — HYDROMORPHONE HCL 1 MG/ML IJ SOLN
0.5000 mg | Freq: Once | INTRAMUSCULAR | Status: AC
  Administered 2017-09-07: 0.5 mg via INTRAVENOUS
  Filled 2017-09-07: qty 1

## 2017-09-07 MED ORDER — HYDROCODONE-ACETAMINOPHEN 5-325 MG PO TABS: ORAL_TABLET | ORAL | 0 refills | Status: DC

## 2017-09-07 MED ORDER — ONDANSETRON HCL 4 MG/2ML IJ SOLN
4.0000 mg | Freq: Once | INTRAMUSCULAR | Status: AC
  Administered 2017-09-07: 4 mg via INTRAVENOUS
  Filled 2017-09-07: qty 2

## 2017-09-07 NOTE — ED Notes (Signed)
Bed: WA07 Expected date:  Expected time:  Means of arrival:  Comments: EMS 42 yo male slipped down 2 steps-tried to catch self with arm/deformity right shoulder-Fentanyl 50 mcg

## 2017-09-07 NOTE — ED Provider Notes (Signed)
[facility]-EMERGENCY DEPT Provider Note   CSN: [id number] Arrival date & time: 09/07/17  0719     History   Chief Complaint Chief Complaint  Patient presents with  . Fall    HPI  Blood pressure (!) 154/97, pulse 70, temperature 98.5 F (36.9 C), temperature source Oral, resp. rate 18, height 5\' 7"  (1.702 m), weight 75.8 kg (167 lb), SpO2 98 %.  Devon Pace is a 42 y.o. male complaining of severe right shoulder pain after mechanical fall last evening after leaving work (patient was not intoxicated) he was walking down the steps he his feet slipped out from under him he put his right arm down to brace himself and felt immediate pain in his right shoulder.  Patient is right-hand dominant, pain is severe despite narcotics via EMS.  He denies any pain in the wrist or elbow.  He is never dislocated the right shoulder in the past.  He denies any other trauma including head trauma or cervicalgia.  He denies any numbness or weakness.  Past Medical History:  Diagnosis Date  . Prediabetes   . Tobacco dependence     Patient Active Problem List   Diagnosis Date Noted  . Chest pain 05/30/2017  . Elevated blood pressure reading 05/30/2017  . Screening cholesterol level 05/30/2017  . Tobacco dependence 11/16/2016  . Abnormal TSH 11/16/2016    History reviewed. No pertinent surgical history.     Home Medications    Prior to Admission medications   Medication Sig Start Date End Date Taking? Authorizing Provider  butalbital-acetaminophen-caffeine (FIORICET, ESGIC) [phone] MG tablet Take 1 tablet by mouth 2 (two) times daily as needed for headache. 02/15/17  Yes Massie MaroonHollis, Lachina M, FNP  ferrous sulfate 325 (65 FE) MG tablet Take 1 tablet (325 mg total) by mouth daily. 07/01/17 07/01/18 Yes Massie MaroonHollis, Lachina M, FNP  fluticasone (FLONASE) 50 MCG/ACT nasal spray Place 2 sprays into both nostrils daily. 08/01/17  Yes Loren RacerYelverton, David, MD  ibuprofen (ADVIL,MOTRIN) 800 MG  tablet Take 1 tablet (800 mg total) by mouth every 6 (six) hours as needed. 06/14/17  Yes Massie MaroonHollis, Lachina M, FNP  loratadine (CLARITIN) 10 MG tablet Take 1 tablet (10 mg total) by mouth daily. 08/01/17  Yes Loren RacerYelverton, David, MD  meclizine (ANTIVERT) 12.5 MG tablet Take 1 tablet (12.5 mg total) by mouth 3 (three) times daily as needed for dizziness. 03/29/17  Yes Massie MaroonHollis, Lachina M, FNP  gabapentin (NEURONTIN) 300 MG capsule Take 1 capsule (300 mg total) by mouth at bedtime. Patient not taking: Reported on 09/07/2017 06/14/17   Massie MaroonHollis, Lachina M, FNP  HYDROcodone-acetaminophen (NORCO/VICODIN) 5-325 MG tablet Take 1-2 tablets by mouth every 6 hours as needed for pain. 09/07/17   Carlene Bickley, Joni ReiningNicole, PA-C    Family History Family History  Problem Relation Age of Onset  . Diabetes Mother   . Diabetes Maternal Grandmother     Social History Social History   Tobacco Use  . Smoking status: Current Every Day Smoker    Packs/day: 0.25  . Smokeless tobacco: Never Used  Substance Use Topics  . Alcohol use: Yes    Comment: occ  . Drug use: No     Allergies   Patient has no known allergies.   Review of Systems Review of Systems  A complete review of systems was obtained and all systems are negative except as noted in the HPI and PMH.   Physical Exam Updated Vital Signs BP (!) 154/97   Pulse 70  Temp 98.5 F (36.9 C) (Oral)   Resp 18   Ht 5\' 7"  (1.702 m)   Wt 75.8 kg (167 lb)   SpO2 98%   BMI 26.16 kg/m   Physical Exam  Constitutional: He is oriented to person, place, and time. He appears well-developed and well-nourished. No distress.  HENT:  Head: Normocephalic and atraumatic.  Mouth/Throat: Oropharynx is clear and moist.  Eyes: Conjunctivae and EOM are normal. Pupils are equal, round, and reactive to light.  Neck: Normal range of motion.  Cardiovascular: Normal rate, regular rhythm and intact distal pulses.  Pulmonary/Chest: Effort normal and breath sounds normal.  Abdominal:  Soft. There is no tenderness.  Musculoskeletal:  Vesely exquisitely tender to light palpation along the rotator cuff musculature on the right, no gross deformity, no focal tenderness over the olecranon, no snuffbox tenderness and no tenderness along the wrist or hands.  Distally neurovascularly intact.  Patient cannot AB duct greater than 10 degrees.  Neurological: He is alert and oriented to person, place, and time.  Skin: He is not diaphoretic.  Psychiatric: He has a normal mood and affect.  Nursing note and vitals reviewed.    ED Treatments / Results  Labs (all labs ordered are listed, but only abnormal results are displayed) Labs Reviewed - No data to display  EKG  EKG Interpretation None       Radiology Dg Shoulder Right  Result Date: 09/07/2017 CLINICAL DATA:  Recent fall with right shoulder pain, initial encounter EXAM: RIGHT SHOULDER - 2+ VIEW COMPARISON:  None. FINDINGS: No acute fracture or dislocation is noted. Mild spurring is noted along the inferior aspect of the acromion. No other focal abnormality is seen. IMPRESSION: No acute abnormality noted. Electronically Signed   By: Alcide Clever M.D.   On: 09/07/2017 08:42    Procedures Procedures (including critical care time)  Medications Ordered in ED Medications  HYDROmorphone (DILAUDID) injection 0.5 mg (0.5 mg Intravenous Given 09/07/17 0803)  ondansetron (ZOFRAN) injection 4 mg (4 mg Intravenous Given 09/07/17 0803)     Initial Impression / Assessment and Plan / ED Course  I have reviewed the triage vital signs and the nursing notes.  Pertinent labs & imaging results that were available during my care of the patient were reviewed by me and considered in my medical decision making (see chart for details).     Vitals:   09/07/17 0724 09/07/17 0725  BP: (!) 154/97   Pulse: 70   Resp: 18   Temp: 98.5 F (36.9 C)   TempSrc: Oral   SpO2: 98%   Weight:  75.8 kg (167 lb)  Height:  5\' 7"  (1.702 m)     Medications  HYDROmorphone (DILAUDID) injection 0.5 mg (0.5 mg Intravenous Given 09/07/17 0803)  ondansetron (ZOFRAN) injection 4 mg (4 mg Intravenous Given 09/07/17 0803)    Devon Pace is 42 y.o. male presenting with significant right shoulder pain status post fall, he put his hand out to brace immitis as he slid down steps.  There does not appear to be any wrist injury.  No elbow injury.  X-ray negative, patient will not move and is in extreme pain.  Likely rotator cuff injury.  Patient is written out of work until he is cleared by orthopedist.  Evaluation does not show pathology that would require ongoing emergent intervention or inpatient treatment. Pt is hemodynamically stable and mentating appropriately. Discussed findings and plan with patient/guardian, who agrees with care plan. All questions answered. Return precautions discussed and  outpatient follow up given.      Final Clinical Impressions(s) / ED Diagnoses   Final diagnoses:  Fall, initial encounter  Rotator cuff injury, right, initial encounter    ED Discharge Orders        Ordered    HYDROcodone-acetaminophen (NORCO/VICODIN) 5-325 MG tablet     09/07/17 0907       Azrael Maddix, Joni Reining, PA-C 09/07/17 1610    Doug Sou, MD 09/07/17 [phone]

## 2017-09-07 NOTE — ED Triage Notes (Signed)
Patient fell leaving work. Patient has deformity on his right shoulder. Patient had fentanyl with EMS. No LOC.

## 2017-09-07 NOTE — Discharge Instructions (Signed)
Only use the arm sling for up to 2 days. Take the arm out and rotate the shoulder every 4 hours.   For pain control please take ibuprofen (also known as Motrin or Advil) 800mg  (this is normally 4 over the counter pills) 3 times a day  for 5 days. Take with food to minimize stomach irritation.   Take vicodin for breakthrough pain, do not drink alcohol, drive, care for children or do other critical tasks while taking vicodin.  Please follow with your primary care doctor in the next 2 days for a check-up. They must obtain records for further management.   Do not hesitate to return to the Emergency Department for any new, worsening or concerning symptoms.

## 2017-12-16 ENCOUNTER — Encounter: Payer: Self-pay | Admitting: Family Medicine

## 2017-12-16 ENCOUNTER — Ambulatory Visit (INDEPENDENT_AMBULATORY_CARE_PROVIDER_SITE_OTHER): Payer: Managed Care, Other (non HMO) | Admitting: Family Medicine

## 2017-12-16 ENCOUNTER — Ambulatory Visit (HOSPITAL_COMMUNITY)
Admission: RE | Admit: 2017-12-16 | Discharge: 2017-12-16 | Disposition: A | Discharge status: Home / Self Care | Payer: Managed Care, Other (non HMO) | Source: Ambulatory Visit | Attending: Family Medicine | Admitting: Family Medicine

## 2017-12-16 VITALS — BP 128/79 | HR 71 | Temp 97.9°F | Resp 16 | Ht 67.0 in | Wt 157.0 lb

## 2017-12-16 DIAGNOSIS — E119 Type 2 diabetes mellitus without complications: Secondary | ICD-10-CM | POA: Diagnosis not present

## 2017-12-16 DIAGNOSIS — R7989 Other specified abnormal findings of blood chemistry: Secondary | ICD-10-CM

## 2017-12-16 DIAGNOSIS — R739 Hyperglycemia, unspecified: Secondary | ICD-10-CM | POA: Diagnosis not present

## 2017-12-16 LAB — CBC
HCT: 42.9 % (ref 39.0–52.0)
HEMOGLOBIN: 13.1 g/dL (ref 13.0–17.0)
MCH: 22.6 pg — ABNORMAL LOW (ref 26.0–34.0)
MCHC: 30.5 g/dL (ref 30.0–36.0)
MCV: 74.1 fL — ABNORMAL LOW (ref 78.0–100.0)
Platelets: 273 10*3/uL (ref 150–400)
RBC: 5.79 MIL/uL (ref 4.22–5.81)
RDW: 12.7 % (ref 11.5–15.5)
WBC: 6.5 10*3/uL (ref 4.0–10.5)

## 2017-12-16 LAB — BASIC METABOLIC PANEL
ANION GAP: 13 (ref 5–15)
BUN: 10 mg/dL (ref 6–20)
CALCIUM: 9.7 mg/dL (ref 8.9–10.3)
CO2: 24 mmol/L (ref 22–32)
Chloride: 101 mmol/L (ref 101–111)
Creatinine, Ser: 0.8 mg/dL (ref 0.61–1.24)
Glucose, Bld: 351 mg/dL — ABNORMAL HIGH (ref 65–99)
POTASSIUM: 4.4 mmol/L (ref 3.5–5.1)
SODIUM: 138 mmol/L (ref 135–145)

## 2017-12-16 LAB — GLUCOSE, POCT (MANUAL RESULT ENTRY): POC GLUCOSE: 368 mg/dL — AB (ref 70–99)

## 2017-12-16 LAB — POCT GLYCOSYLATED HEMOGLOBIN (HGB A1C): HEMOGLOBIN A1C: 12.2

## 2017-12-16 MED ORDER — SODIUM CHLORIDE 0.9 % IV SOLN
INTRAVENOUS | Status: DC
  Administered 2017-12-16: 11:00:00 via INTRAVENOUS

## 2017-12-16 MED ORDER — SITAGLIPTIN PHOS-METFORMIN HCL 50-1000 MG PO TABS: 1.0000 | ORAL_TABLET | Freq: Two times a day (BID) | ORAL | 1 refills | Status: DC

## 2017-12-16 MED ORDER — LISINOPRIL 2.5 MG PO TABS: 2.5000 mg | ORAL_TABLET | Freq: Every day | ORAL | 1 refills | Status: DC

## 2017-12-16 MED ORDER — NON FORMULARY
10.0000 [IU] | Freq: Once | Status: AC
  Administered 2017-12-16: 10 [IU] via SUBCUTANEOUS

## 2017-12-16 NOTE — Progress Notes (Signed)
PATIENT CARE CENTER NOTE  Diagnosis: Hyperglycemia    Provider: Armeniahina Hollis, FNP   Procedure: 1Liter fluid bolus and labs draw   Note: Patient received 1 liter bolus of Normal Saline and labs were drawn. Patient tolerated procedure well with no adverse reaction. Blood sugar 268 at discharge.  Discharge instructions given to patient. Patient alert, oriented and ambulatory at discharge.

## 2017-12-16 NOTE — Progress Notes (Signed)
Devon Pace, a 42 year old male with a history of type 2 diabetes mellitus presents for follow up. Patient states that blood sugars have been consistently elevated. Blood sugars have been consistently greater than 200 since right rotator cuff repair in February 2019. Patient is currently not taking anti-diabetic medications. Patient's most recent hemoglobin a1C on 08/26/2017 was 5.7. He endorses periodic blurred vision. He denies headache, dizziness, chest pain,shortness of breath, polyuria, polydipsia, or polyphagia.  Past Medical History:  Diagnosis Date  . Prediabetes   . Tobacco dependence    Social History   Socioeconomic History  . Marital status: Single    Spouse name: Not on file  . Number of children: Not on file  . Years of education: Not on file  . Highest education level: Not on file  Occupational History  . Not on file  Social Needs  . Financial resource strain: Not on file  . Food insecurity:    Worry: Not on file    Inability: Not on file  . Transportation needs:    Medical: Not on file    Non-medical: Not on file  Tobacco Use  . Smoking status: Current Every Day Smoker    Packs/day: 0.25  . Smokeless tobacco: Never Used  Substance and Sexual Activity  . Alcohol use: Yes    Comment: occ  . Drug use: No  . Sexual activity: Not on file  Lifestyle  . Physical activity:    Days per week: Not on file    Minutes per session: Not on file  . Stress: Not on file  Relationships  . Social connections:    Talks on phone: Not on file    Gets together: Not on file    Attends religious service: Not on file    Active member of club or organization: Not on file    Attends meetings of clubs or organizations: Not on file    Relationship status: Not on file  . Intimate partner violence:    Fear of current or ex partner: Not on file    Emotionally abused: Not on file    Physically abused: Not on file    Forced sexual activity: Not on file  Other Topics Concern  . Not on  file  Social History Narrative  . Not on file   Immunization History  Administered Date(s) Administered  . Pneumococcal Polysaccharide-23 08/18/2016  . Tdap 11/16/2016   No orders of the defined types were placed in this encounter.   Review of Systems  Constitutional: Negative.   HENT: Negative.   Eyes: Positive for blurred vision.  Respiratory: Negative.   Cardiovascular: Negative.   Gastrointestinal: Negative.   Genitourinary: Negative.   Musculoskeletal: Negative.   Skin: Negative.   Neurological: Negative.   Endo/Heme/Allergies: Negative for polydipsia.  Psychiatric/Behavioral: Negative.   BP 128/79 (BP Location: Left Arm, Patient Position: Sitting, Cuff Size: Normal)   Pulse 71   Temp 97.9 F (36.6 C) (Oral)   Resp 16   Ht 5\' 7"  (1.702 m)   Wt 157 lb (71.2 kg)   SpO2 100%   BMI 24.59 kg/m   General Appearance:    Alert, cooperative, no distress, appears stated age  Head:    Normocephalic, without obvious abnormality, atraumatic  Eyes:    PERRL, conjunctiva/corneas clear, EOM's intact, fundi    benign, both eyes       Ears:    Normal TM's and external ear canals, both ears  Nose:   Nares normal,  septum midline, mucosa normal, no drainage   or sinus tenderness  Throat:   Lips, mucosa, and tongue normal; teeth and gums normal  Neck:   Supple, symmetrical, trachea midline, no adenopathy;       thyroid:  No enlargement/tenderness/nodules; no carotid   bruit or JVD  Back:     Symmetric, no curvature, ROM normal, no CVA tenderness  Lungs:     Clear to auscultation bilaterally, respirations unlabored  Chest wall:    No tenderness or deformity  Heart:    Regular rate and rhythm, S1 and S2 normal, no murmur, rub   or gallop  Abdomen:     Soft, non-tender, bowel sounds active all four quadrants,    no masses, no organomegaly  Extremities:   Extremities normal, atraumatic, no cyanosis or edema  Pulses:   2+ and symmetric all extremities  Skin:   Skin color, texture,  turgor normal, no rashes or lesions  Lymph nodes:   Cervical, supraclavicular, and axillary nodes normal  Neurologic:   CNII-XII intact. Normal strength, sensation and reflexes      Throughout  Plan   1. Type 2 diabetes mellitus without complication, without long-term current use of insulin (HCC) Hemoglobin a1C has increased from 5.7 to 12.2, which is above goal. Will start Janumet 50-1000 mg every 12  Hours with food. Discussed carbohydrate modified diet at length. Patient expressed understanding. Mr. Tommie RaymondRidley would benefit from diabetes and nutrition.   - HgB A1c - Urinalysis Dipstick - Urinalysis - Glucose (CBG) - NON FORMULARY 10 Units - sitaGLIPtin-metformin (JANUMET) 50-1000 MG tablet; Take 1 tablet by mouth 2 (two) times daily with a meal.  Dispense: 180 tablet; Refill: 1 - lisinopril (PRINIVIL,ZESTRIL) 2.5 MG tablet; Take 1 tablet (2.5 mg total) by mouth daily.  Dispense: 90 tablet; Refill: 1 - Lipid Panel  2. Hyperglycemia CBG 368 on arrival, transitioned to day infusion center for IV fluid bolus.  - NON FORMULARY 10 Units  3. diabetes hyperglycemia - NON FORMULARY 10 Units  4. Abnormal TSH Patient has hx of abnormal TSH, will repeat.  - TSH  RTC: 2 weeks for DMII follow up   Nolon NationsLachina Moore Traeh Milroy  MSN, FNP-C Patient Care [facility] [address]  [place], [place] [id number] [phone]

## 2017-12-16 NOTE — Discharge Instructions (Signed)
Hyperglycemia Hyperglycemia is when the sugar (glucose) level in your blood is too high. It may not cause symptoms. If you do have symptoms, they may include warning signs, such as:  Feeling more thirsty than normal.  Hunger.  Feeling tired.  Needing to pee (urinate) more than normal.  Blurry eyesight (vision). You may get other symptoms as it gets worse, such as:  Dry mouth.  Not being hungry (loss of appetite).  Fruity-smelling breath.  Weakness.  Weight gain or loss that is not planned. Weight loss may be fast.  A tingling or numb feeling in your hands or feet.  Headache.  Skin that does not bounce back quickly when it is lightly pinched and released (poor skin turgor).  Pain in your belly (abdomen).  Cuts or bruises that heal slowly. High blood sugar can happen to people who do or do not have diabetes. High blood sugar can happen slowly or quickly, and it can be an emergency. Follow these instructions at home: General instructions  Take over-the-counter and prescription medicines only as told by your doctor.  Do not use products that contain nicotine or tobacco, such as cigarettes and e-cigarettes. If you need help quitting, ask your doctor.  Limit alcohol intake to no more than 1 drink per day for nonpregnant women and 2 drinks per day for men. One drink equals 12 oz of beer, 5 oz of wine, or 1 oz of hard liquor.  Manage stress. If you need help with this, ask your doctor.  Keep all follow-up visits as told by your doctor. This is important. Eating and drinking  Stay at a healthy weight.  Exercise regularly, as told by your doctor.  Drink enough fluid, especially when you:  Exercise.  Get sick.  Are in hot temperatures.  Eat healthy foods, such as:  Low-fat (lean) proteins.  Complex carbs (complex carbohydrates), such as whole wheat bread or brown rice.  Fresh fruits and vegetables.  Low-fat dairy products.  Healthy fats.  Drink enough  fluid to keep your pee (urine) clear or pale yellow. If you have diabetes:  Make sure you know the symptoms of hyperglycemia.  Follow your diabetes management plan, as told by your doctor. Make sure you:  Take insulin and medicines as told.  Follow your exercise plan.  Follow your meal plan. Eat on time. Do not skip meals.  Check your blood sugar as often as told. Make sure to check before and after exercise. If you exercise longer or in a different way than you normally do, check your blood sugar more often.  Follow your sick day plan whenever you cannot eat or drink normally. Make this plan ahead of time with your doctor.  Share your diabetes management plan with people in your workplace, school, and household.  Check your urine for ketones when you are ill and as told by your doctor.  Carry a card or wear jewelry that says that you have diabetes. Contact a doctor if:  Your blood sugar level is higher than 240 mg/dL (13.3 mmol/L) for 2 days in a row.  You have problems keeping your blood sugar in your target range.  High blood sugar happens often for you. Get help right away if:  You have trouble breathing.  You have a change in how you think, feel, or act (mental status).  You feel sick to your stomach (nauseous), and that feeling does not go away.  You cannot stop throwing up (vomiting). These symptoms may be an   emergency. Do not wait to see if the symptoms will go away. Get medical help right away. Call your local emergency services (911 in the [place] Do not drive yourself to the hospital.  Summary  Hyperglycemia is when the sugar (glucose) level in your blood is too high.  High blood sugar can happen to people who do or do not have diabetes.  Make sure you drink enough fluids, eat healthy foods, and exercise regularly.  Contact your doctor if you have problems keeping your blood sugar in your target range. This information is not intended to replace advice given  to you by your health care provider. Make sure you discuss any questions you have with your health care provider. Document Released: 06/21/2009 Document Revised: 05/11/2016 Document Reviewed: 05/11/2016 Elsevier Interactive Patient Education  2017 Elsevier Inc.  

## 2017-12-17 LAB — URINALYSIS
Bilirubin, UA: NEGATIVE
Nitrite, UA: NEGATIVE
RBC, UA: NEGATIVE
Specific Gravity, UA: >=1.03 — AB (ref 1.005–1.030)
UUROB: 0.2 mg/dL (ref 0.2–1.0)
pH, UA: 5.5 (ref 5.0–7.5)

## 2017-12-17 LAB — LIPID PANEL
CHOL/HDL RATIO: 5.2 ratio — AB (ref 0.0–5.0)
Cholesterol, Total: 136 mg/dL (ref 100–199)
HDL: 26 mg/dL — ABNORMAL LOW (ref >39)
LDL CALC: 62 mg/dL (ref 0–99)
TRIGLYCERIDES: 240 mg/dL — AB (ref 0–149)
VLDL Cholesterol Cal: 48 mg/dL — ABNORMAL HIGH (ref 5–40)

## 2017-12-21 ENCOUNTER — Telehealth: Payer: Self-pay

## 2017-12-21 NOTE — Telephone Encounter (Signed)
-----   Message from Lachina M Hollis, FNP sent at 12/18/2017  6:31 AM EDT ----- Regarding: lab results Please inform patient that triglycerides are elevated at 246, goal is < 150. Recommend a low fat, low carbohydrate diet. Follow up in office as scheduled.   Lachina Moore Hollis  MSN, FNP-C Patient Care Center [facility] [address]  [place], [place] [address] [phone]  

## 2017-12-21 NOTE — Telephone Encounter (Signed)
Called, no answer. Left a message for patient to call back. Thanks!  

## 2017-12-21 NOTE — Telephone Encounter (Signed)
-----   Message from Massie MaroonLachina M Hollis, Oregon[place]NP sent at 12/18/2017  6:31 AM EDT ----- Regarding: lab results Please inform patient that triglycerides are elevated at 246, goal is < 150. Recommend a low fat, low carbohydrate diet. Follow up in office as scheduled.   Nolon NationsLachina Moore Hollis  MSN, FNP-C Patient Care [facility] [address]  [place], [place] [id number] [phone]

## 2017-12-21 NOTE — Telephone Encounter (Signed)
Called and spoke with patient advised that triglycerides are elevated at 246 and recommended that he eat a low fat/ low carbohydrate diet and keep next scheduled appointment. Patient verbalized understanding. Thanks!

## 2018-01-17 ENCOUNTER — Ambulatory Visit: Payer: Self-pay | Admitting: Family Medicine

## 2018-01-19 ENCOUNTER — Encounter: Payer: Self-pay | Admitting: Family Medicine

## 2018-01-19 ENCOUNTER — Ambulatory Visit (INDEPENDENT_AMBULATORY_CARE_PROVIDER_SITE_OTHER): Payer: Managed Care, Other (non HMO) | Admitting: Family Medicine

## 2018-01-19 VITALS — BP 113/78 | HR 69 | Temp 97.6°F | Resp 14 | Ht 67.0 in | Wt 163.0 lb

## 2018-01-19 DIAGNOSIS — E119 Type 2 diabetes mellitus without complications: Secondary | ICD-10-CM | POA: Diagnosis not present

## 2018-01-19 DIAGNOSIS — F17200 Nicotine dependence, unspecified, uncomplicated: Secondary | ICD-10-CM

## 2018-01-19 LAB — GLUCOSE, POCT (MANUAL RESULT ENTRY)
POC GLUCOSE: 261 mg/dL — AB (ref 70–99)
POC Glucose: 314 mg/dl — AB (ref 70–99)
POC Glucose: 296 mg/dl — AB (ref 70–99)

## 2018-01-19 LAB — POCT GLYCOSYLATED HEMOGLOBIN (HGB A1C): Hemoglobin A1C: 10

## 2018-01-19 MED ORDER — LISINOPRIL 2.5 MG PO TABS: 2.5000 mg | ORAL_TABLET | Freq: Every day | ORAL | 1 refills | Status: DC

## 2018-01-19 MED ORDER — INSULIN GLARGINE 100 UNIT/ML SOLOSTAR PEN: 10.0000 [IU] | PEN_INJECTOR | Freq: Every day | SUBCUTANEOUS | 99 refills | Status: DC

## 2018-01-19 MED ORDER — NON FORMULARY
10.0000 [IU] | Freq: Once | Status: AC
  Administered 2018-01-19: 10 [IU] via SUBCUTANEOUS

## 2018-01-19 MED ORDER — "INSULIN SYRINGE-NEEDLE U-100 31G X 5/16"" 0.3 ML MISC": 1.0000 | Freq: Every day | 5 refills | Status: DC

## 2018-01-19 MED ORDER — SITAGLIPTIN PHOS-METFORMIN HCL 50-1000 MG PO TABS: 1.0000 | ORAL_TABLET | Freq: Two times a day (BID) | ORAL | 1 refills | Status: DC

## 2018-01-19 MED ORDER — ALCOHOL SWABS 70 % PADS: 1.0000 | MEDICATED_PAD | Freq: Every day | 2 refills | Status: DC | PRN

## 2018-01-19 NOTE — Progress Notes (Signed)
Devon Pace, a 42 year old male with a history of type 2 diabetes mellitus presents for follow up. Patient states that blood sugars have been consistently elevated. Blood sugars have been consistently greater than 200 since right rotator cuff repair in February 2019. Patient is currently not taking anti-diabetic medications. Patient's most recent hemoglobin a1C was 12.2.   He endorses periodic blurred vision. He denies headache, dizziness, chest pain,shortness of breath, polyuria, polydipsia, or polyphagia.  Past Medical History:  Diagnosis Date  . Prediabetes   . Tobacco dependence    Social History   Socioeconomic History  . Marital status: Single    Spouse name: Not on file  . Number of children: Not on file  . Years of education: Not on file  . Highest education level: Not on file  Occupational History  . Not on file  Social Needs  . Financial resource strain: Not on file  . Food insecurity:    Worry: Not on file    Inability: Not on file  . Transportation needs:    Medical: Not on file    Non-medical: Not on file  Tobacco Use  . Smoking status: Current Every Day Smoker    Packs/day: 0.25  . Smokeless tobacco: Never Used  Substance and Sexual Activity  . Alcohol use: Yes    Comment: occ  . Drug use: No  . Sexual activity: Not on file  Lifestyle  . Physical activity:    Days per week: Not on file    Minutes per session: Not on file  . Stress: Not on file  Relationships  . Social connections:    Talks on phone: Not on file    Gets together: Not on file    Attends religious service: Not on file    Active member of club or organization: Not on file    Attends meetings of clubs or organizations: Not on file    Relationship status: Not on file  . Intimate partner violence:    Fear of current or ex partner: Not on file    Emotionally abused: Not on file    Physically abused: Not on file    Forced sexual activity: Not on file  Other Topics Concern  . Not on file   Social History Narrative  . Not on file   Immunization History  Administered Date(s) Administered  . Pneumococcal Polysaccharide-23 08/18/2016  . Tdap 11/16/2016   Meds ordered this encounter  Medications  . NON FORMULARY 10 Units    Order Specific Question:   Brand Name:    Answer:   humalog    Order Specific Question:   Generic name:    Answer:   insulin lispo injection    Order Specific Question:   Form:    Answer:   Injection [30]    Order Specific Question:   Length of Therapy:    Answer:   1 day    Order Specific Question:   Reason for Non-Formulary    Answer:   not in epic    Order Specific Question:   How soon needed? (normally 72 hrs needed to procure):    Answer:   0-24 hrs  . sitaGLIPtin-metformin (JANUMET) 50-1000 MG tablet    Sig: Take 1 tablet by mouth 2 (two) times daily with a meal.    Dispense:  180 tablet    Refill:  1  . Insulin Glargine (LANTUS SOLOSTAR) 100 UNIT/ML Solostar Pen    Sig: Inject 10 Units into the  skin daily at 10 pm.    Dispense:  5 pen    Refill:  PRN    Review of Systems  Constitutional: Negative.   HENT: Negative.   Eyes: Positive for blurred vision.  Respiratory: Negative.   Cardiovascular: Negative.   Gastrointestinal: Negative.   Genitourinary: Negative.   Musculoskeletal: Negative.   Skin: Negative.   Neurological: Negative.   Endo/Heme/Allergies: Negative for polydipsia.  Psychiatric/Behavioral: Negative.   BP 113/78 (BP Location: Left Arm, Patient Position: Sitting, Cuff Size: Normal)   Pulse 69   Temp 97.6 F (36.4 C) (Oral)   Resp 14   Ht  (1.702 m)   Wt 163 lb (73.9 kg)   SpO2 100%   BMI 25.53 kg/m  Physical Exam  Constitutional: He is oriented to person, place, and time. He appears well-developed and well-nourished.  Cardiovascular: Normal rate, regular rhythm, normal heart sounds and intact distal pulses.  Pulmonary/Chest: Effort normal and breath sounds normal.  Abdominal: Soft. Bowel sounds are  normal.  Musculoskeletal: Normal range of motion.  Neurological: He is alert and oriented to person, place, and time.  Skin: Skin is warm and dry.  Psychiatric: He has a normal mood and affect. His behavior is normal. Judgment and thought content normal.   Plan    Type 2 diabetes mellitus without complication, without long-term current use of insulin (HCC) Hemoglobin a1C has improved from 12.2 to 10 over the past month. However, CBGs continue to be above 200. Will add Lantus 10 units at bedtime. Demonstrated insulin injection via teach back method. Patient expressed understanding.  CBG 341 on arrival, administered Humalog 10 units, improved over 30 minutes Patient alert,oriented, ambulating prior to leaving. He is also answering all questions appropriately.  - HgB A1c - Glucose (CBG) - NON FORMULARY 10 Units - Glucose (CBG) - sitaGLIPtin-metformin (JANUMET) 50-1000 MG tablet; Take 1 tablet by mouth 2 (two) times daily with a meal.  Dispense: 180 tablet; Refill: 1 - Ambulatory referral to Ophthalmology - Insulin Syringe-Needle U-100 (B-D INS SYR ULTRAFINE .3CC/31G) 31G X 5/16" 0.3 ML MISC; 1 each by Does not apply route at bedtime.  Dispense: 100 each; Refill: 5 - Insulin Glargine (LANTUS SOLOSTAR) 100 UNIT/ML Solostar Pen; Inject 10 Units into the skin daily at 10 pm.  Dispense: 5 pen; Refill: PRN - lisinopril (PRINIVIL,ZESTRIL) 2.5 MG tablet; Take 1 tablet (2.5 mg total) by mouth daily.  Dispense: 90 tablet; Refill: 1 - Ambulatory referral to diabetic education - Alcohol Swabs 70 % PADS; 1 each by Does not apply route daily as needed.  Dispense: 100 each; Refill: 2 - Basic Metabolic Panel - Glucose (CBG)  2. Tobacco dependence Smoking cessation instruction/counseling given:  counseled patient on the dangers of tobacco use, advised patient to stop smoking, and reviewed strategies to maximize success    RTC: 4 weeks for DMII  Nolon Nations  MSN, FNP-C Patient Care [facility] [address] [place], [place] [address] [phone]

## 2018-01-19 NOTE — Patient Instructions (Addendum)
Will start a trial of Lantus 10 units at bedtime Sent a referral to diabetes education to assist with diet plan Also, sent a referral to ophthalmology  Diabetes Mellitus and Nutrition When you have diabetes (diabetes mellitus), it is very important to have healthy eating habits because your blood sugar (glucose) levels are greatly affected by what you eat and drink. Eating healthy foods in the appropriate amounts, at about the same times every day, can help you:  Control your blood glucose.  Lower your risk of heart disease.  Improve your blood pressure.  Reach or maintain a healthy weight.  Every person with diabetes is different, and each person has different needs for a meal plan. Your health care provider may recommend that you work with a diet and nutrition specialist (dietitian) to make a meal plan that is best for you. Your meal plan may vary depending on factors such as:  The calories you need.  The medicines you take.  Your weight.  Your blood glucose, blood pressure, and cholesterol levels.  Your activity level.  Other health conditions you have, such as heart or kidney disease.  How do carbohydrates affect me? Carbohydrates affect your blood glucose level more than any other type of food. Eating carbohydrates naturally increases the amount of glucose in your blood. Carbohydrate counting is a method for keeping track of how many carbohydrates you eat. Counting carbohydrates is important to keep your blood glucose at a healthy level, especially if you use insulin or take certain oral diabetes medicines. It is important to know how many carbohydrates you can safely have in each meal. This is different for every person. Your dietitian can help you calculate how many carbohydrates you should have at each meal and for snack. Foods that contain carbohydrates include:  Bread, cereal, rice, pasta, and crackers.  Potatoes and corn.  Peas, beans, and lentils.  Milk and  yogurt.  Fruit and juice.  Desserts, such as cakes, cookies, ice cream, and candy.  How does alcohol affect me? Alcohol can cause a sudden decrease in blood glucose (hypoglycemia), especially if you use insulin or take certain oral diabetes medicines. Hypoglycemia can be a life-threatening condition. Symptoms of hypoglycemia (sleepiness, dizziness, and confusion) are similar to symptoms of having too much alcohol. If your health care provider says that alcohol is safe for you, follow these guidelines:  Limit alcohol intake to no more than 1 drink per day for nonpregnant women and 2 drinks per day for men. One drink equals 12 oz of beer, 5 oz of wine, or 1 oz of hard liquor.  Do not drink on an empty stomach.  Keep yourself hydrated with water, diet soda, or unsweetened iced tea.  Keep in mind that regular soda, juice, and other mixers may contain a lot of sugar and must be counted as carbohydrates.  What are tips for following this plan? Reading food labels  Start by checking the serving size on the label. The amount of calories, carbohydrates, fats, and other nutrients listed on the label are based on one serving of the food. Many foods contain more than one serving per package.  Check the total grams (g) of carbohydrates in one serving. You can calculate the number of servings of carbohydrates in one serving by dividing the total carbohydrates by 15. For example, if a food has 30 g of total carbohydrates, it would be equal to 2 servings of carbohydrates.  Check the number of grams (g) of saturated and trans fats  in one serving. Choose foods that have low or no amount of these fats.  Check the number of milligrams (mg) of sodium in one serving. Most people should limit total sodium intake to less than 2,300 mg per day.  Always check the nutrition information of foods labeled as "low-fat" or "nonfat". These foods may be higher in added sugar or refined carbohydrates and should be  avoided.  Talk to your dietitian to identify your daily goals for nutrients listed on the label. Shopping  Avoid buying canned, premade, or processed foods. These foods tend to be high in fat, sodium, and added sugar.  Shop around the outside edge of the grocery store. This includes fresh fruits and vegetables, bulk grains, fresh meats, and fresh dairy. Cooking  Use low-heat cooking methods, such as baking, instead of high-heat cooking methods like deep frying.  Cook using healthy oils, such as olive, canola, or sunflower oil.  Avoid cooking with butter, cream, or high-fat meats. Meal planning  Eat meals and snacks regularly, preferably at the same times every day. Avoid going long periods of time without eating.  Eat foods high in fiber, such as fresh fruits, vegetables, beans, and whole grains. Talk to your dietitian about how many servings of carbohydrates you can eat at each meal.  Eat 4-6 ounces of lean protein each day, such as lean meat, chicken, fish, eggs, or tofu. 1 ounce is equal to 1 ounce of meat, chicken, or fish, 1 egg, or 1/4 cup of tofu.  Eat some foods each day that contain healthy fats, such as avocado, nuts, seeds, and fish. Lifestyle   Check your blood glucose regularly.  Exercise at least 30 minutes 5 or more days each week, or as told by your health care provider.  Take medicines as told by your health care provider.  Do not use any products that contain nicotine or tobacco, such as cigarettes and e-cigarettes. If you need help quitting, ask your health care provider.  Work with a Veterinary surgeon or diabetes educator to identify strategies to manage stress and any emotional and social challenges. What are some questions to ask my health care provider?  Do I need to meet with a diabetes educator?  Do I need to meet with a dietitian?  What number can I call if I have questions?  When are the best times to check my blood glucose? Where to find more  information:  American Diabetes Association: diabetes.org/food-and-fitness/food  Academy of Nutrition and Dietetics: https://www.vargas.com/  [employer] of Diabetes and Digestive and Kidney Diseases (NIH): FindJewelers.cz Summary  A healthy meal plan will help you control your blood glucose and maintain a healthy lifestyle.  Working with a diet and nutrition specialist (dietitian) can help you make a meal plan that is best for you.  Keep in mind that carbohydrates and alcohol have immediate effects on your blood glucose levels. It is important to count carbohydrates and to use alcohol carefully. This information is not intended to replace advice given to you by your health care provider. Make sure you discuss any questions you have with your health care provider. Document Released: 05/21/2005 Document Revised: 09/28/2016 Document Reviewed: 09/28/2016 Elsevier Interactive Patient Education  [employer].

## 2018-01-20 ENCOUNTER — Telehealth: Payer: Self-pay

## 2018-01-20 LAB — BASIC METABOLIC PANEL
BUN/Creatinine Ratio: 13 (ref 9–20)
BUN: 11 mg/dL (ref 6–24)
CO2: 22 mmol/L (ref 20–29)
CREATININE: 0.88 mg/dL (ref 0.76–1.27)
Calcium: 9.5 mg/dL (ref 8.7–10.2)
Chloride: 100 mmol/L (ref 96–106)
GFR, EST AFRICAN AMERICAN: 124 mL/min/{1.73_m2} (ref >59)
GFR, EST NON AFRICAN AMERICAN: 107 mL/min/{1.73_m2} (ref >59)
Glucose: 248 mg/dL — ABNORMAL HIGH (ref 65–99)
Potassium: 4.9 mmol/L (ref 3.5–5.2)
Sodium: 137 mmol/L (ref 134–144)

## 2018-01-20 NOTE — Telephone Encounter (Signed)
Called and spoke with patient. Advised that all labs were normal except the glucose. He has not started lantus yet but states he will pick up today and start tonight. Advised the importance of following a carbohydrate modified diet over 5 t 6 small meals daily. Thanks!

## 2018-01-20 NOTE — Telephone Encounter (Signed)
-----   Message from Massie Maroon, [place] sent at 01/20/2018  8:59 AM EDT ----- Regarding: lab results Please inform patient that labs indicate that serum glucose is elevated. All other labs are within normal parameters. Inquire whether he has started Lantus 10 units HS. Also, remind patient of the importance of following a carbohydrate modified diet divided over 5-6 small meals throughout the day.   Nolon Nations  MSN, FNP-C Patient Care [facility] [address] [place], [place] [address] [phone]

## 2018-02-03 ENCOUNTER — Ambulatory Visit: Payer: Self-pay | Admitting: Registered"

## 2018-02-24 ENCOUNTER — Ambulatory Visit (INDEPENDENT_AMBULATORY_CARE_PROVIDER_SITE_OTHER): Payer: Managed Care, Other (non HMO) | Admitting: Family Medicine

## 2018-02-24 ENCOUNTER — Encounter: Payer: Self-pay | Admitting: Family Medicine

## 2018-02-24 VITALS — BP 124/79 | HR 69 | Temp 98.9°F | Resp 16 | Ht 67.0 in | Wt 169.0 lb

## 2018-02-24 DIAGNOSIS — F17200 Nicotine dependence, unspecified, uncomplicated: Secondary | ICD-10-CM

## 2018-02-24 DIAGNOSIS — E119 Type 2 diabetes mellitus without complications: Secondary | ICD-10-CM | POA: Diagnosis not present

## 2018-02-24 LAB — POCT URINALYSIS DIPSTICK
BILIRUBIN UA: NEGATIVE
GLUCOSE UA: NEGATIVE
Ketones, UA: NEGATIVE
Leukocytes, UA: NEGATIVE
Nitrite, UA: NEGATIVE
Protein, UA: NEGATIVE
RBC UA: NEGATIVE
SPEC GRAV UA: 1.025 (ref 1.010–1.025)
UROBILINOGEN UA: 0.2 U/dL
pH, UA: 5.5 (ref 5.0–8.0)

## 2018-02-24 LAB — GLUCOSE, POCT (MANUAL RESULT ENTRY): POC Glucose: 134 mg/dl — AB (ref 70–99)

## 2018-02-24 LAB — POCT GLYCOSYLATED HEMOGLOBIN (HGB A1C): HEMOGLOBIN A1C: 7.4 % — AB (ref 4.0–5.6)

## 2018-02-24 MED ORDER — SITAGLIPTIN PHOSPHATE 50 MG PO TABS: 50.0000 mg | ORAL_TABLET | Freq: Two times a day (BID) | ORAL | 5 refills | Status: DC

## 2018-02-24 MED ORDER — METFORMIN HCL 1000 MG PO TABS
1000.0000 mg | ORAL_TABLET | Freq: Two times a day (BID) | ORAL | 5 refills | Status: DC
Start: 2018-02-24 — End: 2023-01-12

## 2018-02-24 NOTE — Patient Instructions (Addendum)
Your hemoglobin A1c has improved significantly to 7.4.  We will continue Januvia 50 mg twice daily and metformin 1000 mg twice daily.  Take both medications with food   Continue a carbohydrate modified diet divided over small meals throughout the day.  Diabetes and Foot Care Diabetes may cause you to have problems because of poor blood supply (circulation) to your feet and legs. This may cause the skin on your feet to become thinner, break easier, and heal more slowly. Your skin may become dry, and the skin may peel and crack. You may also have nerve damage in your legs and feet causing decreased feeling in them. You may not notice minor injuries to your feet that could lead to infections or more serious problems. Taking care of your feet is one of the most important things you can do for yourself. Follow these instructions at home:  Wear shoes at all times, even in the house. Do not go barefoot. Bare feet are easily injured.  Check your feet daily for blisters, cuts, and redness. If you cannot see the bottom of your feet, use a mirror or ask someone for help.  Wash your feet with warm water (do not use hot water) and mild soap. Then pat your feet and the areas between your toes until they are completely dry. Do not soak your feet as this can dry your skin.  Apply a moisturizing lotion or petroleum jelly (that does not contain alcohol and is unscented) to the skin on your feet and to dry, brittle toenails. Do not apply lotion between your toes.  Trim your toenails straight across. Do not dig under them or around the cuticle. File the edges of your nails with an emery board or nail file.  Do not cut corns or calluses or try to remove them with medicine.  Wear clean socks or stockings every day. Make sure they are not too tight. Do not wear knee-high stockings since they may decrease blood flow to your legs.  Wear shoes that fit properly and have enough cushioning. To break in new shoes, wear  them for just a few hours a day. This prevents you from injuring your feet. Always look in your shoes before you put them on to be sure there are no objects inside.  Do not cross your legs. This may decrease the blood flow to your feet.  If you find a minor scrape, cut, or break in the skin on your feet, keep it and the skin around it clean and dry. These areas may be cleansed with mild soap and water. Do not cleanse the area with peroxide, alcohol, or iodine.  When you remove an adhesive bandage, be sure not to damage the skin around it.  If you have a wound, look at it several times a day to make sure it is healing.  Do not use heating pads or hot water bottles. They may burn your skin. If you have lost feeling in your feet or legs, you may not know it is happening until it is too late.  Make sure your health care provider performs a complete foot exam at least annually or more often if you have foot problems. Report any cuts, sores, or bruises to your health care provider immediately. Contact a health care provider if:  You have an injury that is not healing.  You have cuts or breaks in the skin.  You have an ingrown nail.  You notice redness on your legs or feet.  You feel burning or tingling in your legs or feet.  You have pain or cramps in your legs and feet.  Your legs or feet are numb.  Your feet always feel cold. Get help right away if:  There is increasing redness, swelling, or pain in or around a wound.  There is a red line that goes up your leg.  Pus is coming from a wound.  You develop a fever or as directed by your health care provider.  You notice a bad smell coming from an ulcer or wound. This information is not intended to replace advice given to you by your health care provider. Make sure you discuss any questions you have with your health care provider. Document Released: 08/21/2000 Document Revised: 01/30/2016 Document Reviewed: 01/31/2013 Elsevier  Interactive Patient Education  2017 [employer].

## 2018-02-24 NOTE — Progress Notes (Signed)
Devon Pace, a 42 year old male with a history of type 2 diabetes mellitus presents for follow up. Patient states that blood sugars have been consistently elevated.  Patient states that he has been taking Janumet consistently. He has also been following a carbohydrate modified diet consistently. Patient's most recent hemoglobin a1C was 10.0. He denies headache, dizziness, chest pain,shortness of breath, polyuria, polydipsia, or polyphagia.  Past Medical History:  Diagnosis Date  . Prediabetes   . Tobacco dependence    Social History   Socioeconomic History  . Marital status: Single    Spouse name: Not on file  . Number of children: Not on file  . Years of education: Not on file  . Highest education level: Not on file  Occupational History  . Not on file  Social Needs  . Financial resource strain: Not on file  . Food insecurity:    Worry: Not on file    Inability: Not on file  . Transportation needs:    Medical: Not on file    Non-medical: Not on file  Tobacco Use  . Smoking status: Current Every Day Smoker    Packs/day: 0.25  . Smokeless tobacco: Never Used  Substance and Sexual Activity  . Alcohol use: Yes    Comment: occ  . Drug use: No  . Sexual activity: Not on file  Lifestyle  . Physical activity:    Days per week: Not on file    Minutes per session: Not on file  . Stress: Not on file  Relationships  . Social connections:    Talks on phone: Not on file    Gets together: Not on file    Attends religious service: Not on file    Active member of club or organization: Not on file    Attends meetings of clubs or organizations: Not on file    Relationship status: Not on file  . Intimate partner violence:    Fear of current or ex partner: Not on file    Emotionally abused: Not on file    Physically abused: Not on file    Forced sexual activity: Not on file  Other Topics Concern  . Not on file  Social History Narrative  . Not on file   Immunization History   Administered Date(s) Administered  . Pneumococcal Polysaccharide-23 08/18/2016  . Tdap 11/16/2016   Meds ordered this encounter  Medications  . sitaGLIPtin (JANUVIA) 50 MG tablet    Sig: Take 1 tablet (50 mg total) by mouth 2 (two) times daily.    Dispense:  60 tablet    Refill:  5  . metFORMIN (GLUCOPHAGE) 1000 MG tablet    Sig: Take 1 tablet (1,000 mg total) by mouth 2 (two) times daily with a meal.    Dispense:  60 tablet    Refill:  5    Review of Systems  Constitutional: Negative.   HENT: Negative.   Eyes: Negative for blurred vision.  Respiratory: Negative.   Cardiovascular: Negative.   Gastrointestinal: Negative.   Genitourinary: Negative.   Musculoskeletal: Negative.   Skin: Negative.   Neurological: Negative.   Endo/Heme/Allergies: Negative for polydipsia.  Psychiatric/Behavioral: Negative.   BP 124/79 (BP Location: Left Arm, Patient Position: Sitting, Cuff Size: Normal)   Pulse 69   Temp 98.9 F (37.2 C) (Oral)   Resp 16   Ht 5\' 7"  (1.702 m)   Wt 169 lb (76.7 kg)   SpO2 99%   BMI 26.47 kg/m  Physical Exam  Constitutional: He is oriented to person, place, and time. He appears well-developed and well-nourished.  Cardiovascular: Normal rate, regular rhythm, normal heart sounds and intact distal pulses.  Pulmonary/Chest: Effort normal and breath sounds normal.  Abdominal: Soft. Bowel sounds are normal.  Musculoskeletal: Normal range of motion.  Neurological: He is alert and oriented to person, place, and time.  Skin: Skin is warm and dry.  Psychiatric: He has a normal mood and affect. His behavior is normal. Judgment and thought content normal.   Plan    Type 2 diabetes mellitus without complication, without long-term current use of insulin (HCC) Hemoglobin a1C has improved from 10 to 7.4 over the past month.   - Glucose (CBG) - HgB A1c - sitaGLIPtin (JANUVIA) 50 MG tablet; Take 1 tablet (50 mg total) by mouth 2 (two) times daily.  Dispense: 60 tablet;  Refill: 5 - metFORMIN (GLUCOPHAGE) 1000 MG tablet; Take 1 tablet (1,000 mg total) by mouth 2 (two) times daily with a meal.  Dispense: 60 tablet; Refill: 5 - Basic Metabolic Panel - Urinalysis Dipstick 2. Tobacco dependence Smoking cessation instruction/counseling given:  counseled patient on the dangers of tobacco use, advised patient to stop smoking, and reviewed strategies to maximize success    RTC: 3 months for DMII  Nolon Nations  MSN, FNP-C Patient Care [facility] [address] [place], [place] [address] [phone]

## 2018-02-25 LAB — BASIC METABOLIC PANEL
BUN / CREAT RATIO: 15 (ref 9–20)
BUN: 14 mg/dL (ref 6–24)
CO2: 24 mmol/L (ref 20–29)
Calcium: 9.8 mg/dL (ref 8.7–10.2)
Chloride: 107 mmol/L — ABNORMAL HIGH (ref 96–106)
Creatinine, Ser: 0.92 mg/dL (ref 0.76–1.27)
GFR calc Af Amer: 120 mL/min/{1.73_m2} (ref >59)
GFR calc non Af Amer: 104 mL/min/{1.73_m2} (ref >59)
Glucose: 117 mg/dL — ABNORMAL HIGH (ref 65–99)
Potassium: 5.1 mmol/L (ref 3.5–5.2)
SODIUM: 142 mmol/L (ref 134–144)

## 2018-04-27 ENCOUNTER — Ambulatory Visit: Payer: Self-pay | Admitting: Family Medicine

## 2019-01-05 IMAGING — CT CT HEAD W/O CM
3 of 4 series · 14 of 47 positions shown, 16 images · non-contrast
Comparison: None.

CLINICAL DATA: Non intractable headache, dizziness, blurred vision

EXAM:
CT HEAD WITHOUT CONTRAST
TECHNIQUE: Contiguous axial images were obtained from the base of the skull
through the vertex without intravenous contrast.

[Series 4: head 2.0 h70h · axial · 0.40mm/px · z∈[+1383,+1499]mm · 8 of 74 slices shown, 10 images]
[im 8/74  brain]
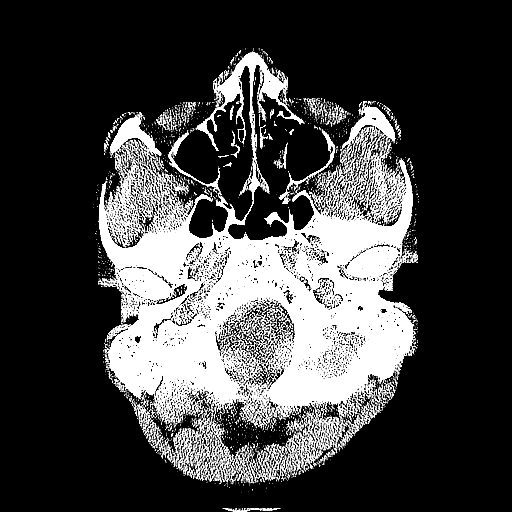
[im 8/74  bone]
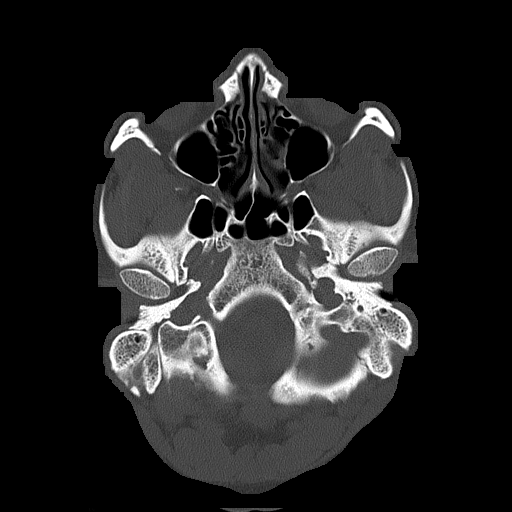
[im 15/74  brain]
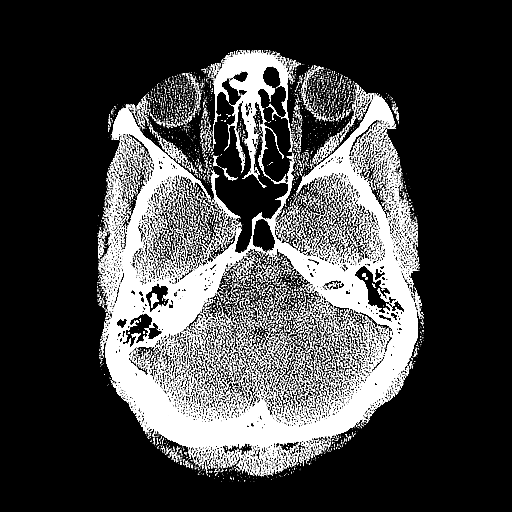
[im 22/74  brain]
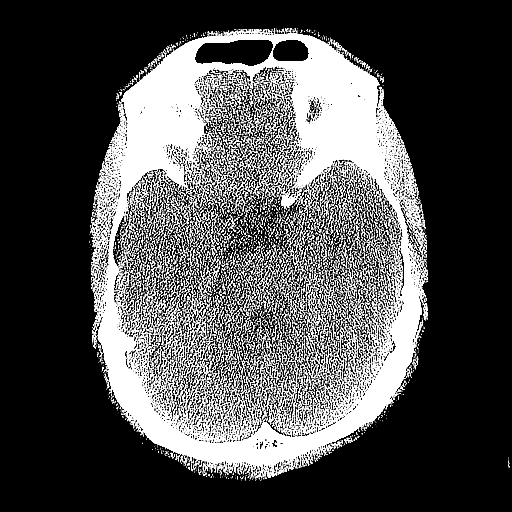
[im 33/74  brain]
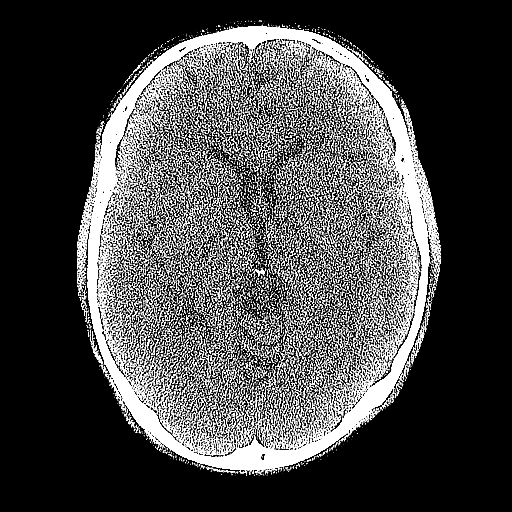
[im 41/74  brain]
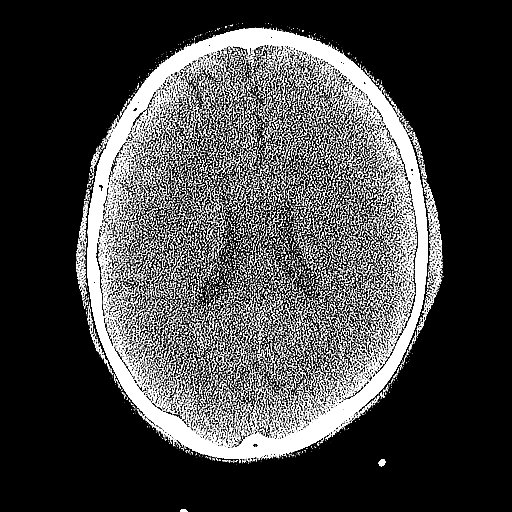
[im 41/74  bone]
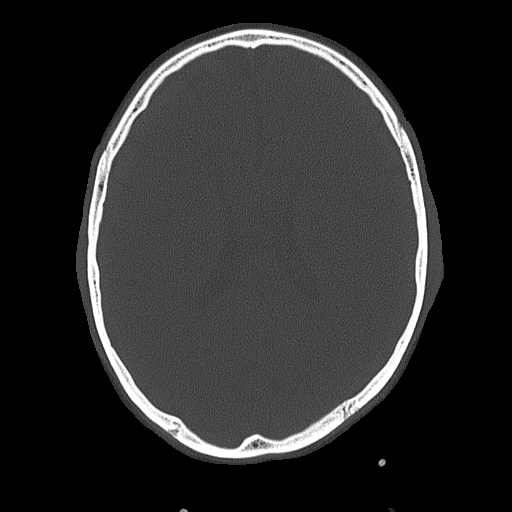
[im 52/74  brain]
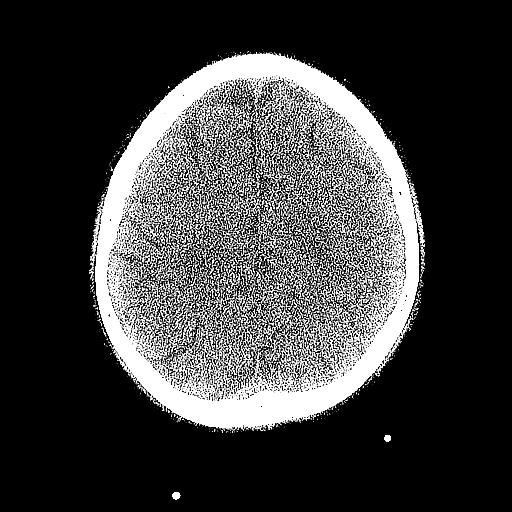
[im 59/74  brain]
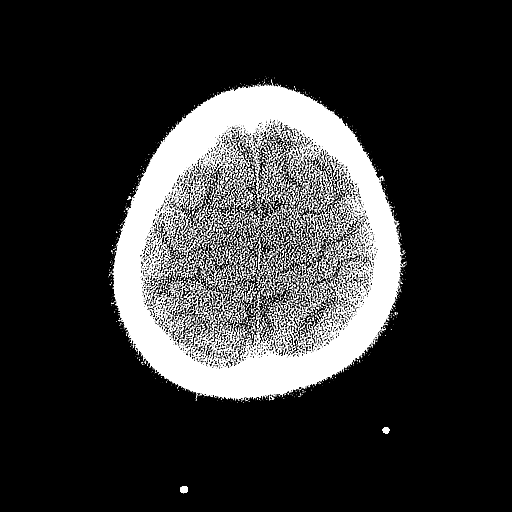
[im 66/74  brain]
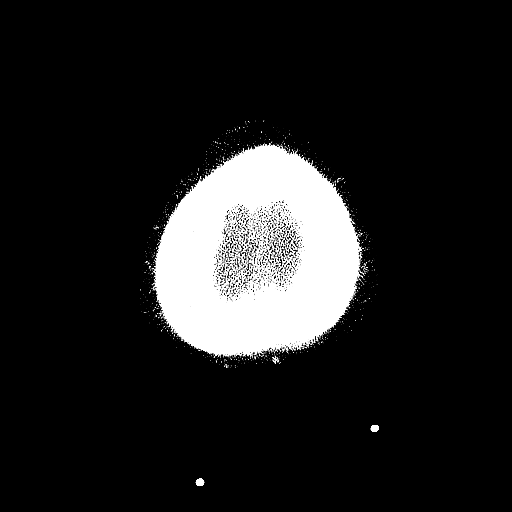

[Series 5: head 3.0 mpr cor · coronal · 0.29mm/px · 3 of 67 slices shown]
[im 23/67  brain]
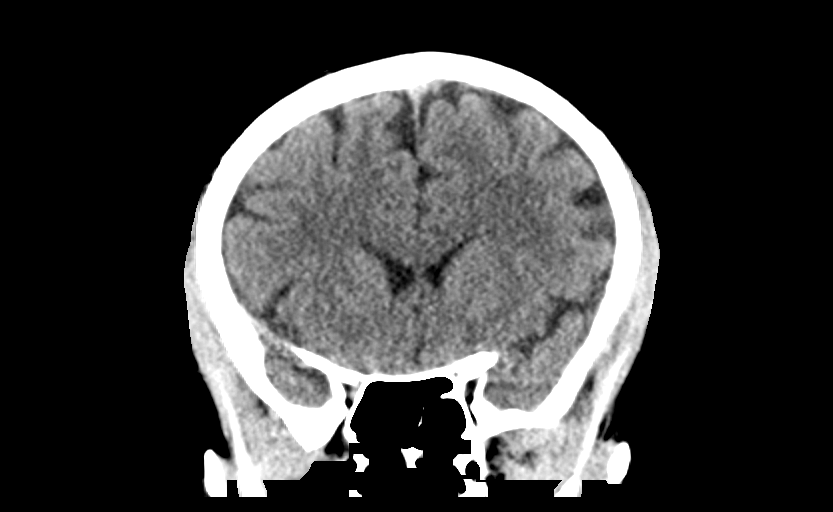
[im 30/67  brain]
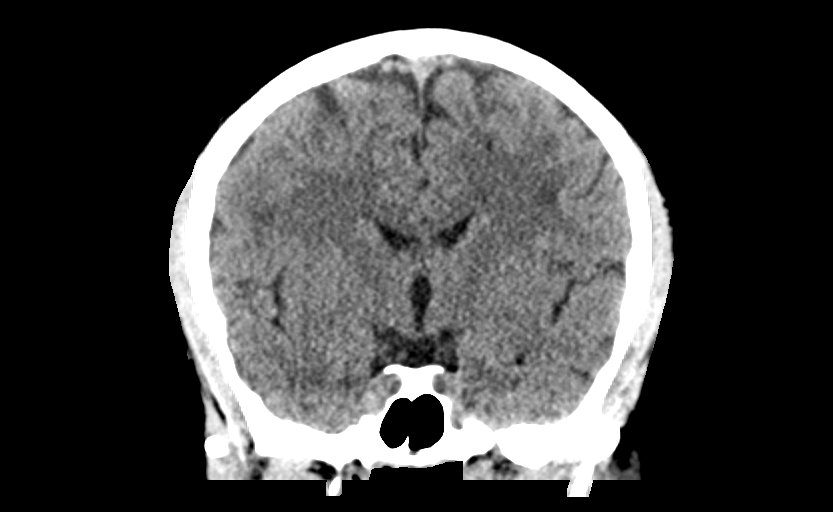
[im 37/67  brain]
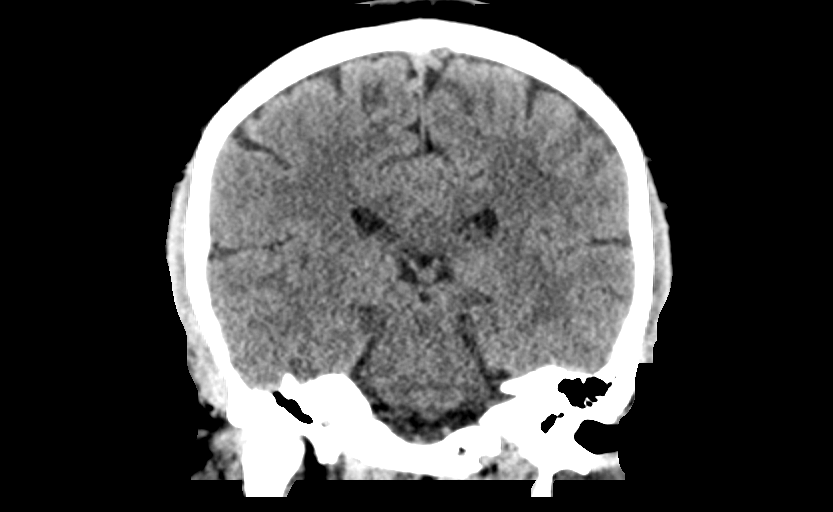

[Series 6: head 3.0 mpr sag · sagittal · 0.29mm/px · 3 of 56 slices shown]
[im 19/56  brain]
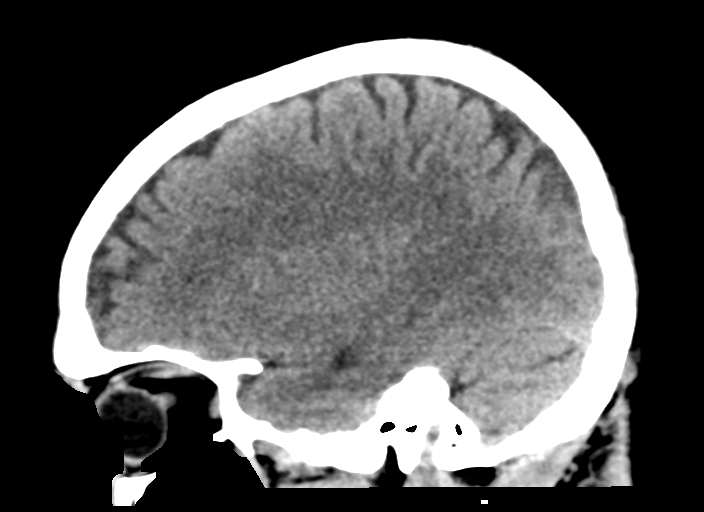
[im 28/56  brain]
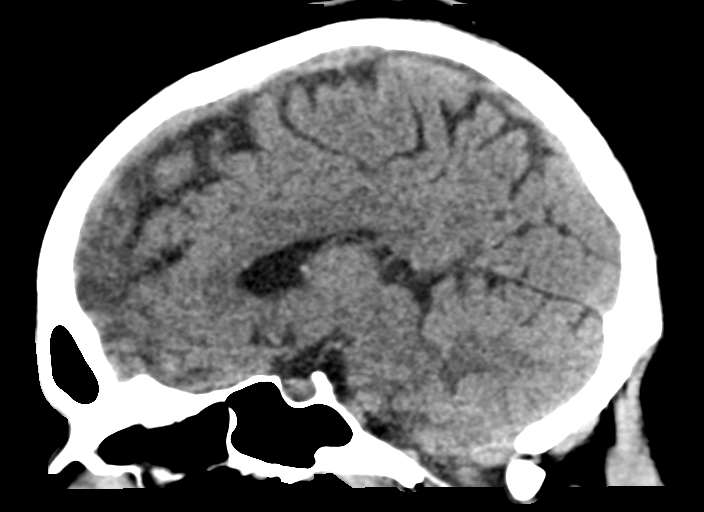
[im 37/56  brain]
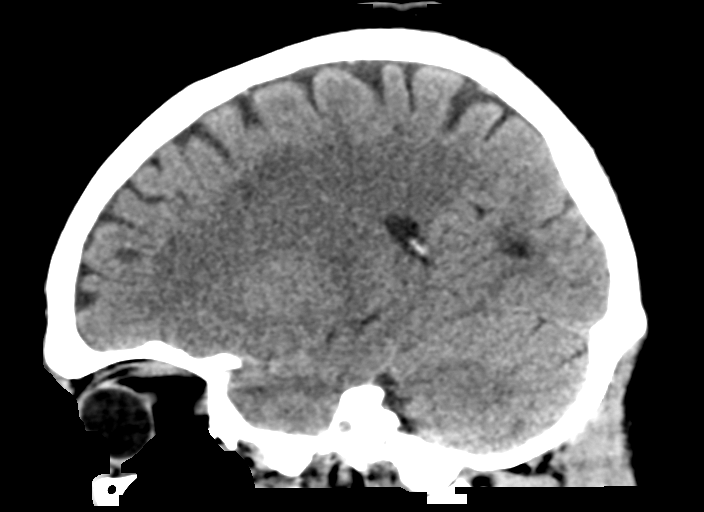

[14 of 47 positions shown; findings below may reference images not displayed]

FINDINGS: Brain: No evidence of acute infarction, hemorrhage, hydrocephalus,
extra-axial collection or mass lesion/mass effect.

Vascular: No hyperdense vessel or unexpected calcification.

Skull: Negative

Sinuses/Orbits: Negative

Other: None
IMPRESSION: Negative CT head

## 2019-05-17 ENCOUNTER — Encounter (HOSPITAL_COMMUNITY): Payer: Self-pay

## 2019-05-17 ENCOUNTER — Encounter (HOSPITAL_COMMUNITY): Payer: Self-pay | Admitting: *Deleted

## 2022-10-14 ENCOUNTER — Ambulatory Visit (INDEPENDENT_AMBULATORY_CARE_PROVIDER_SITE_OTHER): Payer: Medicaid Other | Admitting: Nurse Practitioner

## 2022-10-14 ENCOUNTER — Encounter: Payer: Self-pay | Admitting: Nurse Practitioner

## 2022-10-14 VITALS — BP 118/69 | HR 63 | Temp 97.8°F | Ht 67.0 in | Wt 165.8 lb

## 2022-10-14 DIAGNOSIS — N529 Male erectile dysfunction, unspecified: Secondary | ICD-10-CM | POA: Diagnosis not present

## 2022-10-14 DIAGNOSIS — H4020X3 Unspecified primary angle-closure glaucoma, severe stage: Secondary | ICD-10-CM

## 2022-10-14 DIAGNOSIS — H548 Legal blindness, as defined in USA: Secondary | ICD-10-CM

## 2022-10-14 DIAGNOSIS — E119 Type 2 diabetes mellitus without complications: Secondary | ICD-10-CM

## 2022-10-14 DIAGNOSIS — E1169 Type 2 diabetes mellitus with other specified complication: Secondary | ICD-10-CM

## 2022-10-14 DIAGNOSIS — Z87891 Personal history of nicotine dependence: Secondary | ICD-10-CM

## 2022-10-14 DIAGNOSIS — E785 Hyperlipidemia, unspecified: Secondary | ICD-10-CM

## 2022-10-14 MED ORDER — SILDENAFIL CITRATE 50 MG PO TABS: 50.0000 mg | ORAL_TABLET | Freq: Every day | ORAL | 0 refills | Status: DC | PRN

## 2022-10-14 NOTE — Progress Notes (Signed)
New Patient Office Visit  Subjective:  Patient ID: Devon Pace, male    DOB: 06-May-1976  Age: 47 y.o. MRN: [id number]  CC:  Chief Complaint  Patient presents with   Establish Care    HPI Devon Pace is a 47 y.o. male with past medical history of tobacco abuse, hypertension, controlled type 2 diabetes, open angle glaucoma both eyes, legally blind left eye, erectile dysfunction, hyperlipidemia, presents to establish care for his chronic medical condition.  Previous PCP Aaron Edelman MD at [facility].   Controlled type 2 diabetes.  He is currently on metformin 1000 mg daily, he denies hypoglycemia.  No complaints of polyphagia, polyuria, polydipsia.  Last A1c was 4.9  Hypertension.  Currently on lisinopril 2.5 mg daily, medication was of diet by his previous PCP about 3 months ago but he reports that he has not been taking the medication  Erectile dysfunction .patient complains of erectile dysfunction since about the past 3 months, he was prescribed viagra by his previous PCP but the medication was never filled.  He would like a new prescription today.   He was previously on bupropion for depression and smoking cessation.  He currently denies depression.  States that he quit smoking cigarettes in December 2023.   He is being followed by ophthalmologist at [place]  for his open angle glaucoma of both eyes legally blind on the left eye, stated that he has had multiple eye surgeries.        Past Medical History:  Diagnosis Date   Diabetes mellitus without complication ([place])    Hyperlipidemia    Prediabetes    Tobacco dependence     Past Surgical History:  Procedure Laterality Date   EYE SURGERY     SHOULDER ARTHROSCOPY WITH ROTATOR CUFF REPAIR     right shoulder 09/2017     Family History  Problem Relation Age of Onset   Diabetes Mother    Diabetes Maternal Grandmother    Prostate cancer Paternal Grandfather    Colon cancer Neg Hx     Social  History   Socioeconomic History   Marital status: Single    Spouse name: Not on file   Number of children: Not on file   Years of education: Not on file   Highest education level: Not on file  Occupational History   Not on file  Tobacco Use   Smoking status: Former    Packs/day: 0.25    Types: Cigarettes    Quit date: 08/2022    Years since quitting: 0.1   Smokeless tobacco: Never  Vaping Use   Vaping Use: Some days  Substance and Sexual Activity   Alcohol use: Yes    Comment: occ   Drug use: Not Currently    Types: Marijuana   Sexual activity: Yes  Other Topics Concern   Not on file  Social History Narrative   Lives with his fiance   Social Determinants of Health   Financial Resource Strain: Not on file  Food Insecurity: Not on file  Transportation Needs: Not on file  Physical Activity: Not on file  Stress: Not on file  Social Connections: Not on file  Intimate Partner Violence: Not on file    ROS Review of Systems  Constitutional: Negative.  Negative for activity change, appetite change, chills, diaphoresis, fatigue, fever and unexpected weight change.  Eyes:  Positive for visual disturbance. Negative for pain, redness and itching.  Respiratory: Negative.  Negative for apnea, cough, choking  and chest tightness.   Cardiovascular: Negative.  Negative for chest pain, palpitations and leg swelling.  Gastrointestinal: Negative.  Negative for abdominal distention, abdominal pain, anal bleeding and blood in stool.  Endocrine: Negative for polydipsia, polyphagia and polyuria.  Genitourinary: Negative.  Negative for difficulty urinating, dysuria, enuresis, flank pain and frequency.  Musculoskeletal: Negative.  Negative for arthralgias, back pain, gait problem and joint swelling.  Skin: Negative.   Neurological: Negative.  Negative for dizziness, seizures, facial asymmetry, light-headedness, numbness and headaches.  Psychiatric/Behavioral: Negative.  Negative for  agitation, behavioral problems, confusion, decreased concentration, dysphoric mood and suicidal ideas.     Objective:   Today's Vitals: BP 118/69   Pulse 63   Temp 97.8 F (36.6 C)   Ht 5\' 7"  (1.702 m)   Wt 165 lb 12.8 oz (75.2 kg)   SpO2 100%   BMI 25.97 kg/m   Physical Exam Vitals reviewed.  Constitutional:      General: He is not in acute distress.    Appearance: Normal appearance. He is not ill-appearing, toxic-appearing or diaphoretic.  Cardiovascular:     Rate and Rhythm: Normal rate and regular rhythm.     Pulses: Normal pulses.     Heart sounds: Normal heart sounds. No murmur heard.    No friction rub. No gallop.  Pulmonary:     Effort: Pulmonary effort is normal. No respiratory distress.     Breath sounds: Normal breath sounds. No stridor. No wheezing, rhonchi or rales.  Chest:     Chest wall: No tenderness.  Abdominal:     General: There is no distension.     Palpations: Abdomen is soft.     Tenderness: There is no abdominal tenderness. There is no guarding.  Musculoskeletal:        General: No swelling, tenderness or deformity.     Right lower leg: No edema.     Left lower leg: No edema.  Skin:    Capillary Refill: Capillary refill takes less than 2 seconds.  Neurological:     Mental Status: He is alert and oriented to person, place, and time.     Cranial Nerves: No cranial nerve deficit.     Motor: No weakness.     Coordination: Coordination normal.     Gait: Gait normal.  Psychiatric:        Mood and Affect: Mood normal.        Behavior: Behavior normal.        Thought Content: Thought content normal.        Judgment: Judgment normal.     Assessment & Plan:   Problem List Items Addressed This Visit       Endocrine   Diabetes mellitus type 2, controlled, without complications ([place])    Most recent A1c was 4.9 He has been taking metformin 1000 mg daily He denies hypoglycemia Continue current medication patient counseled on low-carb modified  diet. Will recheck A1c, urine micro albumin creatinine foot exam  in 3 months, up-to-date with this eye exam,       Relevant Medications   atorvastatin (LIPITOR) 20 MG tablet   Hyperlipidemia associated with type 2 diabetes mellitus (HCC)    Currently on Lipitor 20 mg daily Continue current medication Will obtain fasting lipid panel at next visit LDL goal is less than 70      Relevant Medications   atorvastatin (LIPITOR) 20 MG tablet   sildenafil (VIAGRA) 50 MG tablet     Other   Legally blind  in left eye, as defined in [place]   Primary angle closure glaucoma of both eyes, severe stage    He Is being followed by ophthalmologist and [facility].  He was encouraged to maintain close follow-up with ophthalmologist      History of tobacco use    Patient congratulated on smoking cessation he was encouraged to continue to abstain from smoking      Erectile dysfunction - Primary     - sildenafil (VIAGRA) 50 MG tablet; Take 1 tablet (50 mg total) by mouth daily as needed for erectile dysfunction.  Dispense: 10 tablet; Refill: 0       Relevant Medications   sildenafil (VIAGRA) 50 MG tablet    Outpatient Encounter Medications as of 10/14/2022  Medication Sig   Alcohol Swabs 70 % PADS 1 each by Does not apply route daily as needed.   atorvastatin (LIPITOR) 20 MG tablet Take 20 mg by mouth daily.   butalbital-acetaminophen-caffeine (FIORICET, ESGIC) 50-325-40 MG tablet Take 1 tablet by mouth 2 (two) times daily as needed for headache.   metFORMIN (GLUCOPHAGE) 1000 MG tablet Take 1 tablet (1,000 mg total) by mouth 2 (two) times daily with a meal.   sildenafil (VIAGRA) 50 MG tablet Take 1 tablet (50 mg total) by mouth daily as needed for erectile dysfunction.   fluticasone (FLONASE) 50 MCG/ACT nasal spray Place 2 sprays into both nostrils daily. (Patient not taking: Reported on 12/16/2017)   ibuprofen (ADVIL,MOTRIN) 800 MG tablet Take 1 tablet (800 mg total) by mouth every 6 (six)  hours as needed. (Patient not taking: Reported on 10/14/2022)   Insulin Syringe-Needle U-100 (B-D INS SYR ULTRAFINE .3CC/31G) 31G X 5/16" 0.3 ML MISC 1 each by Does not apply route at bedtime. (Patient not taking: Reported on 10/14/2022)   lisinopril (PRINIVIL,ZESTRIL) 2.5 MG tablet Take 1 tablet (2.5 mg total) by mouth daily. (Patient not taking: Reported on 10/14/2022)   loratadine (CLARITIN) 10 MG tablet Take 1 tablet (10 mg total) by mouth daily. (Patient not taking: Reported on 12/16/2017)   [DISCONTINUED] buPROPion (ZYBAN) 150 MG 12 hr tablet Take 150 mg by mouth 2 (two) times daily. (Patient not taking: Reported on 10/14/2022)   [DISCONTINUED] ferrous sulfate 325 (65 FE) MG tablet Take 1 tablet (325 mg total) by mouth daily.   [DISCONTINUED] meclizine (ANTIVERT) 12.5 MG tablet Take 1 tablet (12.5 mg total) by mouth 3 (three) times daily as needed for dizziness. (Patient not taking: Reported on 12/16/2017)   [DISCONTINUED] sitaGLIPtin (JANUVIA) 50 MG tablet Take 1 tablet (50 mg total) by mouth 2 (two) times daily. (Patient not taking: Reported on 10/14/2022)   No facility-administered encounter medications on file as of 10/14/2022.    Follow-up: Return in about 3 months (around 01/12/2023) for DM/HLD.   Renee Rival, FNP

## 2022-10-14 NOTE — Patient Instructions (Addendum)
1. Erectile dysfunction, unspecified erectile dysfunction type  - sildenafil (VIAGRA) 50 MG tablet; Take 1 tablet (50 mg total) by mouth daily as needed for erectile dysfunction.  Dispense: 10 tablet; Refill: 0    It is important that you exercise regularly at least 30 minutes 5 times a week as tolerated  Think about what you will eat, plan ahead. Choose " clean, green, fresh or frozen" over canned, processed or packaged foods which are more sugary, salty and fatty. 70 to 75% of food eaten should be vegetables and fruit. Three meals at set times with snacks allowed between meals, but they must be fruit or vegetables. Aim to eat over a 12 hour period , example 7 am to 7 pm, and STOP after  your last meal of the day. Drink water,generally about 64 ounces per day, no other drink is as healthy. Fruit juice is best enjoyed in a healthy way, by EATING the fruit.  Thanks for choosing [facility] we consider it a privelige to serve you.

## 2022-10-14 NOTE — Assessment & Plan Note (Signed)
-   sildenafil (VIAGRA) 50 MG tablet; Take 1 tablet (50 mg total) by mouth daily as needed for erectile dysfunction.  Dispense: 10 tablet; Refill: 0   

## 2022-10-14 NOTE — Assessment & Plan Note (Signed)
Patient congratulated on smoking cessation he was encouraged to continue to abstain from smoking

## 2022-10-14 NOTE — Assessment & Plan Note (Signed)
He Is being followed by ophthalmologist and [facility].  He was encouraged to maintain close follow-up with ophthalmologist

## 2022-10-14 NOTE — Assessment & Plan Note (Signed)
Currently on Lipitor 20 mg daily Continue current medication Will obtain fasting lipid panel at next visit LDL goal is less than 70

## 2022-10-14 NOTE — Assessment & Plan Note (Addendum)
Most recent A1c was 4.9 He has been taking metformin 1000 mg daily He denies hypoglycemia Continue current medication patient counseled on low-carb modified diet. Will recheck A1c, urine micro albumin creatinine foot exam  in 3 months, up-to-date with this eye exam,

## 2022-12-12 LAB — OPHTHALMOLOGY REPORT-SCANNED

## 2023-01-12 ENCOUNTER — Encounter: Payer: Self-pay | Admitting: Nurse Practitioner

## 2023-01-12 ENCOUNTER — Ambulatory Visit (INDEPENDENT_AMBULATORY_CARE_PROVIDER_SITE_OTHER): Payer: Medicaid Other | Admitting: Nurse Practitioner

## 2023-01-12 VITALS — BP 129/80 | HR 68 | Ht 67.0 in | Wt 168.4 lb

## 2023-01-12 DIAGNOSIS — I10 Essential (primary) hypertension: Secondary | ICD-10-CM

## 2023-01-12 DIAGNOSIS — E1165 Type 2 diabetes mellitus with hyperglycemia: Secondary | ICD-10-CM

## 2023-01-12 DIAGNOSIS — N529 Male erectile dysfunction, unspecified: Secondary | ICD-10-CM | POA: Diagnosis not present

## 2023-01-12 DIAGNOSIS — E785 Hyperlipidemia, unspecified: Secondary | ICD-10-CM

## 2023-01-12 DIAGNOSIS — E1169 Type 2 diabetes mellitus with other specified complication: Secondary | ICD-10-CM | POA: Diagnosis not present

## 2023-01-12 LAB — POCT GLYCOSYLATED HEMOGLOBIN (HGB A1C): Hemoglobin A1C: 8.6 % — AB (ref 4.0–5.6)

## 2023-01-12 MED ORDER — ATORVASTATIN CALCIUM 20 MG PO TABS
20.0000 mg | ORAL_TABLET | Freq: Every day | ORAL | 1 refills | Status: DC
Start: 2023-01-12 — End: 2023-01-15

## 2023-01-12 MED ORDER — SILDENAFIL CITRATE 50 MG PO TABS
50.0000 mg | ORAL_TABLET | Freq: Every day | ORAL | 3 refills | Status: DC | PRN
Start: 2023-01-12 — End: 2023-04-23

## 2023-01-12 MED ORDER — METFORMIN HCL 1000 MG PO TABS
1000.0000 mg | ORAL_TABLET | Freq: Two times a day (BID) | ORAL | 3 refills | Status: DC
Start: 2023-01-12 — End: 2023-04-23

## 2023-01-12 NOTE — Progress Notes (Signed)
Established Patient Office Visit  Subjective:  Patient ID: Devon Pace, male    DOB: [birth year]  Age: 47 y.o. MRN: [id number]  CC:  Chief Complaint  Patient presents with   Follow-up    HPI Devon Pace is a 47 y.o. male  has a past medical history of Blindness of left eye, Diabetes mellitus without complication (HCC), Glaucoma of both eyes, Hyperlipidemia, Prediabetes, and Tobacco dependence.  Patient presents for follow-up for his chronic medical conditions   Type 2 diabetes.  Currently on metformin 1000 mg daily.  Patient reported that he has been drinking juices soda eating cakes, not taking atorvastatin consistently.  He denies polyuria polydipsia polyphagia.  He is still not taking medication for blood pressure.  He recently had a visit with the ophthalmologist in April 2024.     Past Medical History:  Diagnosis Date   Blindness of left eye    Diabetes mellitus without complication (HCC)    Glaucoma of both eyes    Hyperlipidemia    Prediabetes    Tobacco dependence     Past Surgical History:  Procedure Laterality Date   EYE SURGERY     SHOULDER ARTHROSCOPY WITH ROTATOR CUFF REPAIR     right shoulder 09/2017     Family History  Problem Relation Age of Onset   Diabetes Mother    Diabetes Maternal Grandmother    Prostate cancer Paternal Grandfather    Colon cancer Neg Hx     Social History   Socioeconomic History   Marital status: Single    Spouse name: Not on file   Number of children: Not on file   Years of education: Not on file   Highest education level: Not on file  Occupational History   Not on file  Tobacco Use   Smoking status: Former    Packs/day: .25    Types: Cigarettes    Quit date: 08/2022    Years since quitting: 0.4   Smokeless tobacco: Never  Vaping Use   Vaping Use: Some days  Substance and Sexual Activity   Alcohol use: Yes    Comment: occ   Drug use: Not Currently    Types: Marijuana   Sexual activity: Yes  Other Topics  Concern   Not on file  Social History Narrative   Lives with his fiance   Social Determinants of Health   Financial Resource Strain: Not on file  Food Insecurity: Not on file  Transportation Needs: Not on file  Physical Activity: Not on file  Stress: Not on file  Social Connections: Not on file  Intimate Partner Violence: Not on file    Outpatient Medications Prior to Visit  Medication Sig Dispense Refill   ibuprofen (ADVIL,MOTRIN) 800 MG tablet Take 1 tablet (800 mg total) by mouth every 6 (six) hours as needed. 30 tablet 0   atorvastatin (LIPITOR) 20 MG tablet Take 20 mg by mouth daily.     metFORMIN (GLUCOPHAGE) 1000 MG tablet Take 1 tablet (1,000 mg total) by mouth 2 (two) times daily with a meal. 60 tablet 5   Alcohol Swabs 70 % PADS 1 each by Does not apply route daily as needed. (Patient not taking: Reported on 01/12/2023) 100 each 2   butalbital-acetaminophen-caffeine (FIORICET, ESGIC) 50-325-40 MG tablet Take 1 tablet by mouth 2 (two) times daily as needed for headache. 30 tablet 0   fluticasone (FLONASE) 50 MCG/ACT nasal spray Place 2 sprays into both nostrils daily. (Patient not taking: Reported on 12/16/2017) 16  g 0   Insulin Syringe-Needle U-100 (B-D INS SYR ULTRAFINE .3CC/31G) 31G X 5/16" 0.3 ML MISC 1 each by Does not apply route at bedtime. (Patient not taking: Reported on 10/14/2022) 100 each 5   lisinopril (PRINIVIL,ZESTRIL) 2.5 MG tablet Take 1 tablet (2.5 mg total) by mouth daily. (Patient not taking: Reported on 10/14/2022) 90 tablet 1   loratadine (CLARITIN) 10 MG tablet Take 1 tablet (10 mg total) by mouth daily. (Patient not taking: Reported on 12/16/2017) 30 tablet 0   sildenafil (VIAGRA) 50 MG tablet Take 1 tablet (50 mg total) by mouth daily as needed for erectile dysfunction. (Patient not taking: Reported on 01/12/2023) 10 tablet 0   No facility-administered medications prior to visit.    No Known Allergies  ROS Review of Systems  Constitutional:  Negative for  activity change, appetite change, chills, diaphoresis, fatigue, fever and unexpected weight change.  HENT:  Negative for congestion, dental problem, drooling and ear discharge.   Eyes:  Positive for visual disturbance. Negative for pain, discharge, redness and itching.  Respiratory:  Negative for apnea, cough, choking, chest tightness, shortness of breath and wheezing.   Cardiovascular: Negative.  Negative for chest pain, palpitations and leg swelling.  Gastrointestinal:  Negative for abdominal distention, abdominal pain, anal bleeding, blood in stool, constipation, diarrhea and vomiting.  Endocrine: Negative for polydipsia, polyphagia and polyuria.  Genitourinary:  Negative for difficulty urinating, flank pain, frequency and genital sores.  Musculoskeletal: Negative.  Negative for arthralgias, back pain, gait problem and joint swelling.  Skin:  Negative for color change, pallor and rash.  Neurological:  Negative for dizziness, facial asymmetry, light-headedness, numbness and headaches.  Psychiatric/Behavioral:  Negative for agitation, behavioral problems, confusion, hallucinations, self-injury, sleep disturbance and suicidal ideas.       Objective:    Physical Exam Vitals and nursing note reviewed.  Constitutional:      General: He is not in acute distress.    Appearance: Normal appearance. He is obese. He is not ill-appearing, toxic-appearing or diaphoretic.  HENT:     Mouth/Throat:     Mouth: Mucous membranes are moist.     Pharynx: Oropharynx is clear. No oropharyngeal exudate or posterior oropharyngeal erythema.  Eyes:     Conjunctiva/sclera: Conjunctivae normal.     Comments: Legally blind left eye  Cardiovascular:     Rate and Rhythm: Normal rate and regular rhythm.     Pulses: Normal pulses.     Heart sounds: Normal heart sounds. No murmur heard.    No friction rub. No gallop.  Pulmonary:     Effort: Pulmonary effort is normal. No respiratory distress.     Breath sounds:  Normal breath sounds. No stridor. No wheezing, rhonchi or rales.  Chest:     Chest wall: No tenderness.  Abdominal:     General: There is no distension.     Palpations: Abdomen is soft.     Tenderness: There is no abdominal tenderness. There is no right CVA tenderness, left CVA tenderness or guarding.  Musculoskeletal:        General: No swelling, tenderness, deformity or signs of injury.     Right lower leg: No edema.     Left lower leg: No edema.  Skin:    General: Skin is warm and dry.     Capillary Refill: Capillary refill takes 2 to 3 seconds.     Coloration: Skin is not jaundiced or pale.     Findings: No bruising, erythema or lesion.  Neurological:  Mental Status: He is alert and oriented to person, place, and time.     Motor: No weakness.     Coordination: Coordination normal.     Gait: Gait normal.  Psychiatric:        Mood and Affect: Mood normal.        Behavior: Behavior normal.        Thought Content: Thought content normal.        Judgment: Judgment normal.     BP 129/80   Pulse 68   Ht 5\' 7"  (1.702 m)   Wt 168 lb 6.4 oz (76.4 kg)   SpO2 100%   BMI 26.38 kg/m  Wt Readings from Last 3 Encounters:  01/12/23 168 lb 6.4 oz (76.4 kg)  10/14/22 165 lb 12.8 oz (75.2 kg)  02/24/18 169 lb (76.7 kg)    Lab Results  Component Value Date   TSH 0.551 08/26/2017   Lab Results  Component Value Date   WBC 6.5 12/16/2017   HGB 13.1 12/16/2017   HCT 42.9 12/16/2017   MCV 74.1 (L) 12/16/2017   PLT 273 12/16/2017   Lab Results  Component Value Date   NA 142 02/24/2018   K 5.1 02/24/2018   CO2 24 02/24/2018   GLUCOSE 117 (H) 02/24/2018   BUN 14 02/24/2018   CREATININE 0.92 02/24/2018   BILITOT 0.4 05/27/2017   ALKPHOS 78 03/29/2017   AST 22 05/27/2017   ALT 30 05/27/2017   PROT 6.8 05/27/2017   ALBUMIN 4.2 03/29/2017   CALCIUM 9.8 02/24/2018   ANIONGAP 13 12/16/2017   Lab Results  Component Value Date   CHOL 136 12/16/2017   Lab Results   Component Value Date   HDL 26 (L) 12/16/2017   Lab Results  Component Value Date   LDLCALC 62 12/16/2017   Lab Results  Component Value Date   TRIG 240 (H) 12/16/2017   Lab Results  Component Value Date   CHOLHDL 5.2 (H) 12/16/2017   Lab Results  Component Value Date   HGBA1C 8.6 (A) 01/12/2023      Assessment & Plan:   Problem List Items Addressed This Visit       Cardiovascular and Mediastinum   Hypertension    BP Readings from Last 3 Encounters:  01/12/23 129/80  10/14/22 118/69  02/24/18 124/79  Still not on medication but blood pressure is well-controlled Will consider restarting medication if blood pressure becomes uncontrolled goal is less than 130/80 . No changes in management. Discussed DASH diet and dietary sodium restrictions Continue to increase dietary efforts and exercise.         Relevant Medications   atorvastatin (LIPITOR) 20 MG tablet   sildenafil (VIAGRA) 50 MG tablet     Endocrine   DM (diabetes mellitus), type 2 with ophthalmic complications (HCC) - Primary    Lab Results  Component Value Date   HGBA1C 7.4 (A) 02/24/2018  A1c was previously 4.9 about 6 months ago. A1c 8.6 today Currently on metformin 1000 mg daily Start metformin 1000 mg twice daily Patient counseled on low-carb modified diet Diabetic foot exam completed today Urine microalbumin labs ordered Follow-up in 3 months      Relevant Medications   metFORMIN (GLUCOPHAGE) 1000 MG tablet   atorvastatin (LIPITOR) 20 MG tablet   Hyperlipidemia associated with type 2 diabetes mellitus (HCC)    Currently on atorvastatin 20 mg daily not taking medication consistently Checking lipid panel Need to take medication daily as ordered discussed  Relevant Medications   metFORMIN (GLUCOPHAGE) 1000 MG tablet   atorvastatin (LIPITOR) 20 MG tablet   sildenafil (VIAGRA) 50 MG tablet     Other   Erectile dysfunction    Sildenafil 50 mg daily as needed working well Medication  refilled      Relevant Medications   sildenafil (VIAGRA) 50 MG tablet    Meds ordered this encounter  Medications   metFORMIN (GLUCOPHAGE) 1000 MG tablet    Sig: Take 1 tablet (1,000 mg total) by mouth 2 (two) times daily with a meal.    Dispense:  180 tablet    Refill:  3   atorvastatin (LIPITOR) 20 MG tablet    Sig: Take 1 tablet (20 mg total) by mouth daily.    Dispense:  90 tablet    Refill:  1   sildenafil (VIAGRA) 50 MG tablet    Sig: Take 1 tablet (50 mg total) by mouth daily as needed for erectile dysfunction.    Dispense:  10 tablet    Refill:  3    Follow-up: Return in about 3 months (around 04/14/2023) for DM.    Donell Beers, FNP

## 2023-01-12 NOTE — Assessment & Plan Note (Signed)
BP Readings from Last 3 Encounters:  01/12/23 129/80  10/14/22 118/69  02/24/18 124/79  Still not on medication but blood pressure is well-controlled Will consider restarting medication if blood pressure becomes uncontrolled goal is less than 130/80 . No changes in management. Discussed DASH diet and dietary sodium restrictions Continue to increase dietary efforts and exercise.

## 2023-01-12 NOTE — Patient Instructions (Signed)
1. Hyperlipidemia associated with type 2 diabetes mellitus (HCC)  - atorvastatin (LIPITOR) 20 MG tablet; Take 1 tablet (20 mg total) by mouth daily.  Dispense: 90 tablet; Refill: 1  2. Erectile dysfunction, unspecified erectile dysfunction type  - sildenafil (VIAGRA) 50 MG tablet; Take 1 tablet (50 mg total) by mouth daily as needed for erectile dysfunction.  Dispense: 10 tablet; Refill: 3  3. Uncontrolled type 2 diabetes mellitus with hyperglycemia (HCC)  - Microalbumin / creatinine urine ratio - POCT glycosylated hemoglobin (Hb A1C) - CBC - Lipid panel - CMP14+EGFR - metFORMIN (GLUCOPHAGE) 1000 MG tablet; Take 1 tablet (1,000 mg total) by mouth 2 (two) times daily with a meal.  Dispense: 180 tablet; Refill: 3 - atorvastatin (LIPITOR) 20 MG tablet; Take 1 tablet (20 mg total) by mouth daily.  Dispense: 90 tablet; Refill: 1     It is important that you exercise regularly at least 30 minutes 5 times a week as tolerated  Think about what you will eat, plan ahead. Choose " clean, green, fresh or frozen" over canned, processed or packaged foods which are more sugary, salty and fatty. 70 to 75% of food eaten should be vegetables and fruit. Three meals at set times with snacks allowed between meals, but they must be fruit or vegetables. Aim to eat over a 12 hour period , example 7 am to 7 pm, and STOP after  your last meal of the day. Drink water,generally about 64 ounces per day, no other drink is as healthy. Fruit juice is best enjoyed in a healthy way, by EATING the fruit.  Thanks for choosing [facility] we consider it a privelige to serve you.

## 2023-01-12 NOTE — Assessment & Plan Note (Addendum)
Lab Results  Component Value Date   HGBA1C 7.4 (A) 02/24/2018  A1c was previously 4.9 about 6 months ago. A1c 8.6 today Currently on metformin 1000 mg daily Start metformin 1000 mg twice daily Patient counseled on low-carb modified diet Diabetic foot exam completed today Urine microalbumin labs ordered Follow-up in 3 months

## 2023-01-12 NOTE — Assessment & Plan Note (Signed)
Currently on atorvastatin 20 mg daily not taking medication consistently Checking lipid panel Need to take medication daily as ordered discussed

## 2023-01-12 NOTE — Assessment & Plan Note (Signed)
Sildenafil 50 mg daily as needed working well Medication refilled

## 2023-01-13 ENCOUNTER — Other Ambulatory Visit: Payer: Self-pay | Admitting: Nurse Practitioner

## 2023-01-13 DIAGNOSIS — D649 Anemia, unspecified: Secondary | ICD-10-CM

## 2023-01-13 LAB — CBC
Hematocrit: 41.6 % (ref 37.5–51.0)
Hemoglobin: 12.1 g/dL — ABNORMAL LOW (ref 13.0–17.7)
MCH: 22.2 pg — ABNORMAL LOW (ref 26.6–33.0)
MCHC: 29.1 g/dL — ABNORMAL LOW (ref 31.5–35.7)
MCV: 76 fL — ABNORMAL LOW (ref 79–97)
Platelets: 232 10*3/uL (ref 150–450)
RBC: 5.46 x10E6/uL (ref 4.14–5.80)
RDW: 11.9 % (ref 11.6–15.4)
WBC: 4.9 10*3/uL (ref 3.4–10.8)

## 2023-01-13 LAB — CMP14+EGFR
ALT: 50 IU/L — ABNORMAL HIGH (ref 0–44)
AST: 25 IU/L (ref 0–40)
Albumin/Globulin Ratio: 1.5 (ref 1.2–2.2)
Albumin: 4.3 g/dL (ref 4.1–5.1)
Alkaline Phosphatase: 102 IU/L (ref 44–121)
BUN/Creatinine Ratio: 7 — ABNORMAL LOW (ref 9–20)
BUN: 7 mg/dL (ref 6–24)
Bilirubin Total: 0.3 mg/dL (ref 0.0–1.2)
CO2: 21 mmol/L (ref 20–29)
Calcium: 9.5 mg/dL (ref 8.7–10.2)
Chloride: 98 mmol/L (ref 96–106)
Creatinine, Ser: 1.01 mg/dL (ref 0.76–1.27)
Globulin, Total: 2.8 g/dL (ref 1.5–4.5)
Glucose: 427 mg/dL — ABNORMAL HIGH (ref 70–99)
Potassium: 4.6 mmol/L (ref 3.5–5.2)
Sodium: 136 mmol/L (ref 134–144)
Total Protein: 7.1 g/dL (ref 6.0–8.5)
eGFR: 93 mL/min/{1.73_m2} (ref >59)

## 2023-01-13 LAB — LIPID PANEL
Chol/HDL Ratio: 4.6 ratio (ref 0.0–5.0)
Cholesterol, Total: 164 mg/dL (ref 100–199)
HDL: 36 mg/dL — ABNORMAL LOW (ref >39)
LDL Chol Calc (NIH): 99 mg/dL (ref 0–99)
Triglycerides: 167 mg/dL — ABNORMAL HIGH (ref 0–149)
VLDL Cholesterol Cal: 29 mg/dL (ref 5–40)

## 2023-01-13 LAB — MICROALBUMIN / CREATININE URINE RATIO
Creatinine, Urine: 95.7 mg/dL
Microalb/Creat Ratio: 10 mg/g creat (ref 0–29)
Microalbumin, Urine: 9.1 ug/mL

## 2023-01-15 ENCOUNTER — Other Ambulatory Visit: Payer: Self-pay | Admitting: Nurse Practitioner

## 2023-01-15 DIAGNOSIS — E785 Hyperlipidemia, unspecified: Secondary | ICD-10-CM

## 2023-01-15 MED ORDER — ATORVASTATIN CALCIUM 40 MG PO TABS
40.0000 mg | ORAL_TABLET | Freq: Every day | ORAL | 1 refills | Status: DC
Start: 2023-01-15 — End: 2023-07-26

## 2023-03-30 LAB — SPECIMEN STATUS REPORT

## 2023-03-30 LAB — IRON,TIBC AND FERRITIN PANEL
Ferritin: 503 ng/mL — ABNORMAL HIGH (ref 30–400)
Iron Saturation: 27 % (ref 15–55)
Iron: 81 ug/dL (ref 38–169)
Total Iron Binding Capacity: 297 ug/dL (ref 250–450)
UIBC: 216 ug/dL (ref 111–343)

## 2023-04-14 ENCOUNTER — Ambulatory Visit: Payer: Self-pay | Admitting: Nurse Practitioner

## 2023-04-20 ENCOUNTER — Ambulatory Visit: Payer: Self-pay | Admitting: Nurse Practitioner

## 2023-04-23 ENCOUNTER — Ambulatory Visit (INDEPENDENT_AMBULATORY_CARE_PROVIDER_SITE_OTHER): Payer: Medicaid Other | Admitting: Nurse Practitioner

## 2023-04-23 ENCOUNTER — Encounter: Payer: Self-pay | Admitting: Nurse Practitioner

## 2023-04-23 VITALS — BP 128/86 | HR 64 | Ht 67.0 in | Wt 159.0 lb

## 2023-04-23 DIAGNOSIS — D1722 Benign lipomatous neoplasm of skin and subcutaneous tissue of left arm: Secondary | ICD-10-CM | POA: Diagnosis not present

## 2023-04-23 DIAGNOSIS — I10 Essential (primary) hypertension: Secondary | ICD-10-CM

## 2023-04-23 DIAGNOSIS — E785 Hyperlipidemia, unspecified: Secondary | ICD-10-CM

## 2023-04-23 DIAGNOSIS — Z91148 Patient's other noncompliance with medication regimen for other reason: Secondary | ICD-10-CM

## 2023-04-23 DIAGNOSIS — N529 Male erectile dysfunction, unspecified: Secondary | ICD-10-CM | POA: Diagnosis not present

## 2023-04-23 DIAGNOSIS — E1169 Type 2 diabetes mellitus with other specified complication: Secondary | ICD-10-CM

## 2023-04-23 LAB — POCT GLYCOSYLATED HEMOGLOBIN (HGB A1C): HbA1c, POC (controlled diabetic range): 11.7 % — AB (ref 0.0–7.0)

## 2023-04-23 MED ORDER — SILDENAFIL CITRATE 50 MG PO TABS
50.0000 mg | ORAL_TABLET | Freq: Every day | ORAL | 3 refills | Status: DC | PRN
Start: 2023-04-23 — End: 2024-03-20

## 2023-04-23 MED ORDER — METFORMIN HCL 1000 MG PO TABS: 1000.0000 mg | ORAL_TABLET | Freq: Two times a day (BID) | ORAL | 3 refills | Status: DC

## 2023-04-23 NOTE — Assessment & Plan Note (Signed)
Takes Viagra 50 mg daily as needed.  He was unable to get medication filled at the pharmacy, medication probably not on the preferred list for his insurance.  Patient would like me to resend the medication to the pharamacy.  I told the patient to call the office if he has any problems getting the medication we could consider Cialis.

## 2023-04-23 NOTE — Assessment & Plan Note (Addendum)
BP Readings from Last 3 Encounters:  04/23/23 128/86  01/12/23 129/80  10/14/22 118/69  Not on med Discussed DASH diet and dietary sodium restrictions Continue to increase dietary efforts and exercise.

## 2023-04-23 NOTE — Patient Instructions (Signed)
1. Uncontrolled type 2 diabetes mellitus with hyperglycemia (HCC)  - POCT glycosylated hemoglobin (Hb A1C) - metFORMIN (GLUCOPHAGE) 1000 MG tablet; Take 1 tablet (1,000 mg total) by mouth 2 (two) times daily with a meal.  Dispense: 180 tablet; Refill: 3 - Lipid panel - Amb Referral to Nutrition and Diabetic Education  2. Erectile dysfunction, unspecified erectile dysfunction type  - sildenafil (VIAGRA) 50 MG tablet; Take 1 tablet (50 mg total) by mouth daily as needed for erectile dysfunction.  Dispense: 10 tablet; Refill: 3    Goal for fasting blood sugar ranges from 80 to 120 and 2 hours after any meal or at bedtime should be between 130 to 170.   It is important that you exercise regularly at least 30 minutes 5 times a week as tolerated  Think about what you will eat, plan ahead. Choose " clean, green, fresh or frozen" over canned, processed or packaged foods which are more sugary, salty and fatty. 70 to 75% of food eaten should be vegetables and fruit. Three meals at set times with snacks allowed between meals, but they must be fruit or vegetables. Aim to eat over a 12 hour period , example 7 am to 7 pm, and STOP after  your last meal of the day. Drink water,generally about 64 ounces per day, no other drink is as healthy. Fruit juice is best enjoyed in a healthy way, by EATING the fruit.  Thanks for choosing [facility] we consider it a privelige to serve you.

## 2023-04-23 NOTE — Assessment & Plan Note (Signed)
No tenderness redness or drainage noted on examination I discussed with the patient that there is no indication for surgery at this time.  He was encouraged to report tenderness

## 2023-04-23 NOTE — Progress Notes (Signed)
Established Patient Office Visit  Subjective:  Patient ID: Devon Pace, male    DOB: October 23, 1975  Age: 47 y.o. MRN: [id number]  CC:  Chief Complaint  Patient presents with   Medical Management of Chronic Issues    Knot on back    HPI Devon Pace is a 47 y.o. male  has a past medical history of Blindness of left eye, Diabetes mellitus without complication (HCC), Glaucoma of both eyes, Hyperlipidemia, and Tobacco dependence.  Patient presents for follow-up for his chronic medical conditions  Uncontrolled type 2 diabetes.  Has metformin 1000 mg twice daily ordered but the patient states that he has been taking only 1000 mg of metformin.  He also stated that he has not been taking the medication consistently.  Blood sugar readings as being in the 200s at home.  He also reported not following a low-carb diet.  No complaints of polyphagia polyuria polydipsia.  Takes atorvastatin 40 mg daily for hyperlipidemia.    Past Medical History:  Diagnosis Date   Blindness of left eye    Diabetes mellitus without complication (HCC)    Glaucoma of both eyes    Hyperlipidemia    Tobacco dependence     Past Surgical History:  Procedure Laterality Date   EYE SURGERY     SHOULDER ARTHROSCOPY WITH ROTATOR CUFF REPAIR     right shoulder 09/2017     Family History  Problem Relation Age of Onset   Diabetes Mother    Diabetes Maternal Grandmother    Prostate cancer Paternal Grandfather    Colon cancer Neg Hx     Social History   Socioeconomic History   Marital status: Single    Spouse name: Not on file   Number of children: Not on file   Years of education: Not on file   Highest education level: Not on file  Occupational History   Not on file  Tobacco Use   Smoking status: Former    Current packs/day: 0.00    Types: Cigarettes    Quit date: 08/2022    Years since quitting: 0.7   Smokeless tobacco: Never  Vaping Use   Vaping status: Some Days  Substance and Sexual Activity    Alcohol use: Yes    Comment: occ   Drug use: Not Currently    Types: Marijuana   Sexual activity: Yes  Other Topics Concern   Not on file  Social History Narrative   Lives with his fiance   Social Determinants of Health   Financial Resource Strain: High Risk (11/04/2021)   Received from [facility], [facility]   Overall Financial Resource Strain (CARDIA)    Difficulty of Paying Living Expenses: Very hard  Food Insecurity: Food Insecurity Present (11/04/2021)   Received from [facility], [facility]   Hunger Vital Sign    Worried About Running Out of Food in the Last Year: Often true    Ran Out of Food in the Last Year: Often true  Transportation Needs: Unmet Transportation Needs (11/04/2021)   Received from [facility], [facility]   [facility] - Transportation    In the past 12 months, has lack of transportation kept you from medical appointments or from getting medications?: No    Lack of Transportation (Non-Medical): Yes  Physical Activity: Inactive (11/04/2021)   Received from [facility], [facility]   Exercise Vital Sign    Days  of Exercise per Week: 0 days    Minutes of Exercise per Session: 0 min  Stress: Stress Concern Present (11/04/2021)   Received from [facility], [facility]   [employer] of Occupational Health - Occupational Stress Questionnaire    Feeling of Stress : Very much  Social Connections: Socially Integrated (11/04/2021)   Received from [facility], [facility]   Social Connection and Isolation Panel [NHANES]    Frequency of Communication with Friends and Family: Three times a week    Frequency of Social Gatherings with Friends and Family: Not on file    Attends Religious Services: More than 4 times per year    Active Member of Clubs or  Organizations: Yes    Attends Engineer, structural: More than 4 times per year    Marital Status: Living with partner  Intimate Partner Violence: Not on file    Outpatient Medications Prior to Visit  Medication Sig Dispense Refill   atorvastatin (LIPITOR) 40 MG tablet Take 1 tablet (40 mg total) by mouth daily. 90 tablet 1   metFORMIN (GLUCOPHAGE) 1000 MG tablet Take 1 tablet (1,000 mg total) by mouth 2 (two) times daily with a meal. 180 tablet 3   Alcohol Swabs 70 % PADS 1 each by Does not apply route daily as needed. (Patient not taking: Reported on 01/12/2023) 100 each 2   butalbital-acetaminophen-caffeine (FIORICET, ESGIC) 50-325-40 MG tablet Take 1 tablet by mouth 2 (two) times daily as needed for headache. (Patient not taking: Reported on 04/23/2023) 30 tablet 0   fluticasone (FLONASE) 50 MCG/ACT nasal spray Place 2 sprays into both nostrils daily. (Patient not taking: Reported on 12/16/2017) 16 g 0   ibuprofen (ADVIL,MOTRIN) 800 MG tablet Take 1 tablet (800 mg total) by mouth every 6 (six) hours as needed. (Patient not taking: Reported on 04/23/2023) 30 tablet 0   lisinopril (PRINIVIL,ZESTRIL) 2.5 MG tablet Take 1 tablet (2.5 mg total) by mouth daily. (Patient not taking: Reported on 10/14/2022) 90 tablet 1   loratadine (CLARITIN) 10 MG tablet Take 1 tablet (10 mg total) by mouth daily. (Patient not taking: Reported on 12/16/2017) 30 tablet 0   Insulin Syringe-Needle U-100 (B-D INS SYR ULTRAFINE .3CC/31G) 31G X 5/16" 0.3 ML MISC 1 each by Does not apply route at bedtime. (Patient not taking: Reported on 10/14/2022) 100 each 5   sildenafil (VIAGRA) 50 MG tablet Take 1 tablet (50 mg total) by mouth daily as needed for erectile dysfunction. (Patient not taking: Reported on 04/23/2023) 10 tablet 3   No facility-administered medications prior to visit.    No Known Allergies  ROS Review of Systems  Constitutional:  Negative for activity change, appetite change, chills, diaphoresis, fatigue,  fever and unexpected weight change.  HENT:  Negative for congestion, dental problem, drooling and ear discharge.   Eyes:  Positive for visual disturbance. Negative for pain, discharge, redness and itching.  Respiratory:  Negative for apnea, cough, choking, chest tightness, shortness of breath and wheezing.   Cardiovascular: Negative.  Negative for chest pain, palpitations and leg swelling.  Gastrointestinal:  Negative for abdominal distention, abdominal pain, anal bleeding, blood in stool, constipation, diarrhea and vomiting.  Endocrine: Negative for polydipsia, polyphagia and polyuria.  Genitourinary:  Negative for difficulty urinating, flank pain, frequency and genital sores.  Musculoskeletal: Negative.  Negative for arthralgias, back pain, gait problem and joint swelling.  Skin:  Negative for color change, pallor and rash.  Neurological:  Negative  for dizziness, facial asymmetry, light-headedness, numbness and headaches.  Psychiatric/Behavioral:  Negative for agitation, behavioral problems, confusion, hallucinations, self-injury, sleep disturbance and suicidal ideas.       Objective:    Physical Exam Vitals and nursing note reviewed.  Constitutional:      General: He is not in acute distress.    Appearance: Normal appearance. He is not ill-appearing, toxic-appearing or diaphoretic.  HENT:     Mouth/Throat:     Mouth: Mucous membranes are moist.     Pharynx: Oropharynx is clear. No oropharyngeal exudate or posterior oropharyngeal erythema.  Cardiovascular:     Rate and Rhythm: Normal rate and regular rhythm.     Pulses: Normal pulses.     Heart sounds: Normal heart sounds. No murmur heard.    No friction rub. No gallop.  Pulmonary:     Effort: Pulmonary effort is normal. No respiratory distress.     Breath sounds: Normal breath sounds. No stridor. No wheezing, rhonchi or rales.  Chest:     Chest wall: No tenderness.  Abdominal:     General: There is no distension.      Palpations: Abdomen is soft.     Tenderness: There is no abdominal tenderness. There is no right CVA tenderness, left CVA tenderness or guarding.  Musculoskeletal:        General: No swelling, tenderness, deformity or signs of injury.     Right lower leg: No edema.     Left lower leg: No edema.  Skin:    General: Skin is warm and dry.     Capillary Refill: Capillary refill takes less than 2 seconds.     Coloration: Skin is not jaundiced or pale.     Findings: No bruising, erythema or lesion.  Neurological:     Mental Status: He is alert and oriented to person, place, and time.     Motor: No weakness.     Coordination: Coordination normal.     Gait: Gait normal.  Psychiatric:        Mood and Affect: Mood normal.        Behavior: Behavior normal.        Thought Content: Thought content normal.        Judgment: Judgment normal.     BP 128/86   Pulse 64   Ht 5\' 7"  (1.702 m)   Wt 159 lb (72.1 kg)   SpO2 100%   BMI 24.90 kg/m  Wt Readings from Last 3 Encounters:  04/23/23 159 lb (72.1 kg)  01/12/23 168 lb 6.4 oz (76.4 kg)  10/14/22 165 lb 12.8 oz (75.2 kg)    Lab Results  Component Value Date   TSH 0.551 08/26/2017   Lab Results  Component Value Date   WBC 4.9 01/12/2023   HGB 12.1 (L) 01/12/2023   HCT 41.6 01/12/2023   MCV 76 (L) 01/12/2023   PLT 232 01/12/2023   Lab Results  Component Value Date   NA 136 01/12/2023   K 4.6 01/12/2023   CO2 21 01/12/2023   GLUCOSE 427 (H) 01/12/2023   BUN 7 01/12/2023   CREATININE 1.01 01/12/2023   BILITOT 0.3 01/12/2023   ALKPHOS 102 01/12/2023   AST 25 01/12/2023   ALT 50 (H) 01/12/2023   PROT 7.1 01/12/2023   ALBUMIN 4.3 01/12/2023   CALCIUM 9.5 01/12/2023   ANIONGAP 13 12/16/2017   EGFR 93 01/12/2023   Lab Results  Component Value Date   CHOL 164 01/12/2023   Lab Results  Component Value Date   HDL 36 (L) 01/12/2023   Lab Results  Component Value Date   LDLCALC 99 01/12/2023   Lab Results  Component Value  Date   TRIG 167 (H) 01/12/2023   Lab Results  Component Value Date   CHOLHDL 4.6 01/12/2023   Lab Results  Component Value Date   HGBA1C 11.7 (A) 04/23/2023      Assessment & Plan:   Problem List Items Addressed This Visit       Cardiovascular and Mediastinum   Hypertension    BP Readings from Last 3 Encounters:  04/23/23 128/86  01/12/23 129/80  10/14/22 118/69  Not on med Discussed DASH diet and dietary sodium restrictions Continue to increase dietary efforts and exercise.         Relevant Medications   sildenafil (VIAGRA) 50 MG tablet     Endocrine   Hyperlipidemia associated with type 2 diabetes mellitus (HCC)    Lab Results  Component Value Date   HGBA1C 11.7 (A) 04/23/2023    Lab Results  Component Value Date   CHOL 164 01/12/2023   HDL 36 (L) 01/12/2023   LDLCALC 99 01/12/2023   TRIG 167 (H) 01/12/2023   CHOLHDL 4.6 01/12/2023  Patient not compliant with his medication regimen Encouraged to take metformin 1000 mg twice daily Patient counseled on low-carb modified diet Patient referred for diabetes education Checking lipid panel today takes atorvastatin 40 mg daily CBG goals discussed with the patient Follow-up in 3 months      Relevant Medications   metFORMIN (GLUCOPHAGE) 1000 MG tablet   sildenafil (VIAGRA) 50 MG tablet   Other Relevant Orders   POCT glycosylated hemoglobin (Hb A1C) (Completed)   Lipid panel   Amb Referral to Nutrition and Diabetic Education   AMB Referral to Pharmacy Medication Management     Other   Erectile dysfunction    Takes Viagra 50 mg daily as needed.  He was unable to get medication filled at the pharmacy, medication probably not on the preferred list for his insurance.  Patient would like me to resend the medication to the pharamacy.  I told the patient to call the office if he has any problems getting the medication we could consider Cialis.       Relevant Medications   sildenafil (VIAGRA) 50 MG tablet    Lipoma of left shoulder    No tenderness redness or drainage noted on examination I discussed with the patient that there is no indication for surgery at this time.  He was encouraged to report tenderness       Non compliance w medication regimen   Relevant Orders   AMB Referral to Pharmacy Medication Management   Other Visit Diagnoses     Uncontrolled type 2 diabetes mellitus with hyperglycemia (HCC)    -  Primary   Relevant Medications   metFORMIN (GLUCOPHAGE) 1000 MG tablet       Meds ordered this encounter  Medications   metFORMIN (GLUCOPHAGE) 1000 MG tablet    Sig: Take 1 tablet (1,000 mg total) by mouth 2 (two) times daily with a meal.    Dispense:  180 tablet    Refill:  3   sildenafil (VIAGRA) 50 MG tablet    Sig: Take 1 tablet (50 mg total) by mouth daily as needed for erectile dysfunction.    Dispense:  10 tablet    Refill:  3    Follow-up: No follow-ups on file.    Donell Beers,  FNP

## 2023-04-23 NOTE — Assessment & Plan Note (Signed)
Lab Results  Component Value Date   HGBA1C 11.7 (A) 04/23/2023    Lab Results  Component Value Date   CHOL 164 01/12/2023   HDL 36 (L) 01/12/2023   LDLCALC 99 01/12/2023   TRIG 167 (H) 01/12/2023   CHOLHDL 4.6 01/12/2023  Patient not compliant with his medication regimen Encouraged to take metformin 1000 mg twice daily Patient counseled on low-carb modified diet Patient referred for diabetes education Checking lipid panel today takes atorvastatin 40 mg daily CBG goals discussed with the patient Follow-up in 3 months

## 2023-04-24 LAB — LIPID PANEL
Chol/HDL Ratio: 2.7 ratio (ref 0.0–5.0)
Cholesterol, Total: 101 mg/dL (ref 100–199)
HDL: 37 mg/dL — ABNORMAL LOW (ref >39)
LDL Chol Calc (NIH): 48 mg/dL (ref 0–99)
Triglycerides: 80 mg/dL (ref 0–149)
VLDL Cholesterol Cal: 16 mg/dL (ref 5–40)

## 2023-04-27 ENCOUNTER — Telehealth: Payer: Self-pay

## 2023-04-27 NOTE — Progress Notes (Signed)
   Care Guide Note  04/27/2023 Name: Devon Pace MRN: [id number] DOB: 04-19-1976  Referred by: Donell Beers, FNP Reason for referral : Care Management (Outreach to schedule with pharm d )   Devon Pace is a 47 y.o. year old male who is a primary care patient of Donell Beers, FNP. [name] was referred to the pharmacist for assistance related to DM.    Successful contact was made with the patient to discuss pharmacy services including being ready for the pharmacist to call at least 5 minutes before the scheduled appointment time, to have medication bottles and any blood sugar or blood pressure readings ready for review. The patient agreed to meet with the pharmacist via with the pharmacist via telephone visit on (date/time).  05/20/2023  Penne Lash, RMA Care Guide [facility]  [place], [place] [address] Direct Dial: [phone] [email or web address]

## 2023-05-19 NOTE — Progress Notes (Signed)
05/19/2023 Name: Devon Pace MRN: [id number] DOB: 09-04-1976  No chief complaint on file.   Devon Pace is a 47 y.o. year old male who presented for a telephone visit.   They were referred to the pharmacist by their PCP for assistance in managing diabetes.    Subjective:  Care Team: Primary Care Provider: Donell Beers, FNP ; Next Scheduled Visit: 07/26/23  Medication Access/Adherence  Current Pharmacy:  [id number] [place], [place] - [address] AT [facility] [address] & [facility] [address] [place] [address] Phone: [phone] Fax: [phone]   Patient reports affordability concerns with their medications: No  Patient reports access/transportation concerns to their pharmacy: No  Patient reports adherence concerns with their medications:  No     Diabetes:  Current medications: metformin 1000 mg twice daily  Reports having diarrhea with metformin 2-3x/day on most days of the week. Notes this is distressing and prevents him from leaving the house at times. Extensively discussed diet today. Only eating one meal per day. He is unable to cook food on his own due to poor eyesight. Significant other prepares all meals. Sometimes she will prepare meals ahead of time, but he reports difficulty getting food out of container on to plate to heat up when she is not at home. Notes its easier to eat microwave meals, but has tried to cut back as he knows this is not the healthiest option.  Current glucose readings: FBG is 183 while we are chatting on the phone, but has not been checking Has glucometer and supplies  Patient denies hypoglycemic s/sx including dizziness, shakiness, sweating. Patient denies hyperglycemic symptoms including polyuria, polydipsia, polyphagia, nocturia, neuropathy, blurred vision.  Current meal patterns:  1 meal/day, reports no issues with access to food - Breakfast: skips - Lunch: fruit, baked [place] wings -  Supper: sometimes has dinner, tries not to eat after 7 PM to avoid acid reflux when lays down at night - Snacks: chips - Drinks: water - has cut back on fried food - trying to cut back on processed foods   Current physical activity: has starting walking 15-20 minutes every day   Objective:  Lab Results  Component Value Date   HGBA1C 11.7 (A) 04/23/2023    Lab Results  Component Value Date   CREATININE 1.01 01/12/2023   BUN 7 01/12/2023   NA 136 01/12/2023   K 4.6 01/12/2023   CL 98 01/12/2023   CO2 21 01/12/2023    Lab Results  Component Value Date   CHOL 101 04/23/2023   HDL 37 (L) 04/23/2023   LDLCALC 48 04/23/2023   TRIG 80 04/23/2023   CHOLHDL 2.7 04/23/2023    Medications Reviewed Today   Medications were not reviewed in this encounter       Assessment/Plan:   Diabetes: - Currently uncontrolled based on A1c 11.7% in August, unable to assess BG control today due to lack of data - Confirmed he is able to use glucometer at home and has supplies on hand - Reviewed long term cardiovascular and renal outcomes of uncontrolled blood sugar - Reviewed goal A1c, goal fasting, and goal 2 hour post prandial glucose - Reviewed dietary modifications including MyPlate method to ensure more balanced meals, preparing meals with significant other, and placing food in pre portioned containers so that he is able to easily reheat food by himself - Reviewed lifestyle modifications including: increasing physical activity to 30 minutes 5 days of the week - Recommend  to switch metformin to XR 1000 mg twice daily d/t diarrhea - Recommend to check glucose once daily in the morning prior to eating   Follow Up Plan: telephone visit in 4 weeks, PCP on 07/26/23  Adam Phenix, PharmD PGY-1 Pharmacy Resident

## 2023-05-20 ENCOUNTER — Other Ambulatory Visit: Payer: Managed Care, Other (non HMO) | Admitting: Pharmacist

## 2023-05-20 NOTE — Patient Instructions (Signed)
Hi Mr. Macvicar,  It was great talking with you today!  I will reach out to your primary care provider regarding the switch to extended release metformin. This should help with your diarrhea.  Please check your blood sugar once daily in the morning and keep a log of the values to discuss at visits.  Thanks,  [employer]

## 2023-05-23 MED ORDER — METFORMIN HCL ER 500 MG PO TB24: 1000.0000 mg | ORAL_TABLET | Freq: Two times a day (BID) | ORAL | 2 refills | Status: DC

## 2023-06-17 ENCOUNTER — Other Ambulatory Visit: Payer: Medicaid Other | Admitting: Pharmacist

## 2023-06-17 NOTE — Patient Instructions (Signed)
Devon Pace,   It was great talking to you today!  Pick up and start the new metformin. This version has a lower risk of causing diarrhea.   I recommend we start a weekly injectable called Trulicity. This works to help you body better produce insulin in response to high sugars, and can help you work on decreasing portion sizes. Please think about this and discuss with your primary care provider in November.   Check your blood sugars twice daily:  1) Fasting, first thing in the morning before breakfast and  2) 2 hours after your largest meal.   For a goal A1c of less than 7%, goal fasting readings are less than 130 and goal 2 hour after meal readings are less than 180.    Take care!  Catie Eppie Gibson, PharmD, BCACP, CPP Clinical Pharmacist [facility] [phone]

## 2023-06-17 NOTE — Progress Notes (Signed)
   06/17/2023 Name: Devon Pace MRN: [id number] DOB: 12/15/1975  Chief Complaint  Patient presents with   Diabetes   Medication Management   Hypertension    Devon Pace is a 47 y.o. year old male who presented for a telephone visit.   They were referred to the pharmacist by their PCP for assistance in managing diabetes and hyperlipidemia.    Subjective:  Care Team: Primary Care Provider: Donell Beers, FNP ; Next Scheduled Visit: 07/26/23  Medication Access/Adherence  Current Pharmacy:  [id number] - [place], [place] - [address] AT [facility] [address] & [facility] [address] [place] [address] Phone: [phone] Fax: [phone]   Patient reports affordability concerns with their medications: No  Patient reports access/transportation concerns to their pharmacy: No  Patient reports adherence concerns with their medications:  No     Diabetes:  Current medications: metformin 1000 mg twice daily - has not picked up the XR formulation yet  Current glucose readings: fasting: 109-220s; reports most fasting readings are greater than 130  Current meal patterns: attempting to cut back on portion sizes. Notes that he can tell his next morning fastings are significantly higher when he has larger portion sizes before bed. Still attempting to stop eating before 7-8 pm.   Hyperlipidemia/ASCVD Risk Reduction  Current lipid lowering medications: atorvastatin 40 mg daily  Objective:  Lab Results  Component Value Date   HGBA1C 11.7 (A) 04/23/2023    Lab Results  Component Value Date   CREATININE 1.01 01/12/2023   BUN 7 01/12/2023   NA 136 01/12/2023   K 4.6 01/12/2023   CL 98 01/12/2023   CO2 21 01/12/2023    Lab Results  Component Value Date   CHOL 101 04/23/2023   HDL 37 (L) 04/23/2023   LDLCALC 48 04/23/2023   TRIG 80 04/23/2023   CHOLHDL 2.7 04/23/2023       Assessment/Plan:   Diabetes: - Currently  uncontrolled - Reviewed long term cardiovascular and renal outcomes of uncontrolled blood sugar - Reviewed goal A1c, goal fasting, and goal 2 hour post prandial glucose - Reiterated to pick up metformin XR and start, anticipate less diarrhea with this formulation. Glucose remains uncontrolled, recommend addition of GLP1 such as Trulicity. Patient notes he would be able to administer the injection weekly. Discussed mechanism of action, side effects. No history pancreatitis/gallbladder disease, known personal or family history of medullary thyroid cancer. Patient elects to continue current regimen and discuss with PCP in 4 weeks. - Recommend to check glucose twice daily, fasting and 2 hour post prandial  Hyperlipidemia/ASCVD Risk Reduction: - Currently controlled.  - Recommend to continue current regimen at this time  Follow Up Plan: PCP in 4 weeks, PharmD in 8 weeks  Catie TClearance Coots, PharmD, BCACP, CPP Clinical Pharmacist [facility] [phone]

## 2023-07-26 ENCOUNTER — Ambulatory Visit (INDEPENDENT_AMBULATORY_CARE_PROVIDER_SITE_OTHER): Payer: Medicaid Other | Admitting: Nurse Practitioner

## 2023-07-26 ENCOUNTER — Encounter: Payer: Self-pay | Admitting: Nurse Practitioner

## 2023-07-26 VITALS — BP 132/82 | HR 64 | Wt 147.9 lb

## 2023-07-26 DIAGNOSIS — E1169 Type 2 diabetes mellitus with other specified complication: Secondary | ICD-10-CM | POA: Diagnosis not present

## 2023-07-26 DIAGNOSIS — E785 Hyperlipidemia, unspecified: Secondary | ICD-10-CM | POA: Diagnosis not present

## 2023-07-26 DIAGNOSIS — Z23 Encounter for immunization: Secondary | ICD-10-CM | POA: Diagnosis not present

## 2023-07-26 DIAGNOSIS — I10 Essential (primary) hypertension: Secondary | ICD-10-CM | POA: Diagnosis not present

## 2023-07-26 LAB — POCT GLYCOSYLATED HEMOGLOBIN (HGB A1C): Hemoglobin A1C: 7.5 % — AB (ref 4.0–5.6)

## 2023-07-26 MED ORDER — METFORMIN HCL ER 500 MG PO TB24
1000.0000 mg | ORAL_TABLET | Freq: Two times a day (BID) | ORAL | 2 refills | Status: DC
Start: 2023-07-26 — End: 2023-11-12

## 2023-07-26 MED ORDER — ATORVASTATIN CALCIUM 40 MG PO TABS: 40.0000 mg | ORAL_TABLET | Freq: Every day | ORAL | 1 refills | Status: DC

## 2023-07-26 NOTE — Patient Instructions (Signed)
Goal for fasting blood sugar ranges from 80 to 120 and 2 hours after any meal or at bedtime should be between 130 to 170.    Blood pressure goal is less than 130 over 80s.  Please continue to monitor your blood pressure at home and report blood pressure readings consistently greater than 130/80.   Continue metformin 1000 mg twice daily avoid sugar sweets soda  It is important that you exercise regularly at least 30 minutes 5 times a week as tolerated  Think about what you will eat, plan ahead. Choose " clean, green, fresh or frozen" over canned, processed or packaged foods which are more sugary, salty and fatty. 70 to 75% of food eaten should be vegetables and fruit. Three meals at set times with snacks allowed between meals, but they must be fruit or vegetables. Aim to eat over a 12 hour period , example 7 am to 7 pm, and STOP after  your last meal of the day. Drink water,generally about 64 ounces per day, no other drink is as healthy. Fruit juice is best enjoyed in a healthy way, by EATING the fruit.  Thanks for choosing [facility] we consider it a privelige to serve you.

## 2023-07-26 NOTE — Assessment & Plan Note (Addendum)
BP Readings from Last 3 Encounters:  07/26/23 132/82  04/23/23 128/86  01/12/23 129/80   HTN Fairly Controlled . Will start lisinopril 2.5 mg daily at next visit if blood pressure is greater than 130/80 Discussed DASH diet and dietary sodium restrictions Continue to increase dietary efforts and exercise.  Follow-up in 3 months

## 2023-07-26 NOTE — Progress Notes (Signed)
Established Patient Office Visit  Subjective:  Patient ID: Devon Pace, male    DOB: 09-30-1975  Age: 47 y.o. MRN: [id number]  CC:  Chief Complaint  Patient presents with   Diabetes    HPI Devon Pace is a 47 y.o. male  has a past medical history of Blindness of left eye, Diabetes mellitus without complication (HCC), Glaucoma of both eyes, Hyperlipidemia, and Tobacco dependence.  Patient presents for follow-up for type 2 diabetes Type 2 diabetes.  Currently on metformin 1000 mg twice daily.  He has stopped eating beyond 8 PM also doing more exercises, drinks mainly water.  Patient denies polyphagia polyuria polydipsia, hypoglycemia   Hypertension.  Currently not on medication, blood pressure readings at home has been mostly less than 130 over 80s.  Denies chest pain dizziness edema   Past Medical History:  Diagnosis Date   Blindness of left eye    Diabetes mellitus without complication (HCC)    Glaucoma of both eyes    Hyperlipidemia    Tobacco dependence     Past Surgical History:  Procedure Laterality Date   EYE SURGERY     SHOULDER ARTHROSCOPY WITH ROTATOR CUFF REPAIR     right shoulder 09/2017     Family History  Problem Relation Age of Onset   Diabetes Mother    Diabetes Maternal Grandmother    Prostate cancer Paternal Grandfather    Colon cancer Neg Hx     Social History   Socioeconomic History   Marital status: Single    Spouse name: Not on file   Number of children: Not on file   Years of education: Not on file   Highest education level: Not on file  Occupational History   Not on file  Tobacco Use   Smoking status: Former    Current packs/day: 0.00    Types: Cigarettes    Quit date: 08/2022    Years since quitting: 0.9   Smokeless tobacco: Never  Vaping Use   Vaping status: Some Days  Substance and Sexual Activity   Alcohol use: Yes    Comment: occ   Drug use: Not Currently    Types: Marijuana   Sexual activity: Yes  Other Topics Concern    Not on file  Social History Narrative   Lives with his fiance   Social Determinants of Health   Financial Resource Strain: High Risk (11/04/2021)   Received from [facility], [facility]   Overall Financial Resource Strain (CARDIA)    Difficulty of Paying Living Expenses: Very hard  Food Insecurity: Food Insecurity Present (11/04/2021)   Received from [facility], [facility]   Hunger Vital Sign    Worried About Running Out of Food in the Last Year: Often true    Ran Out of Food in the Last Year: Often true  Transportation Needs: Unmet Transportation Needs (11/04/2021)   Received from [facility], [facility]   [facility] - Transportation    In the past 12 months, has lack of transportation kept you from medical appointments or from getting medications?: No    Lack of Transportation (Non-Medical): Yes  Physical Activity: Inactive (11/04/2021)   Received from [facility], [facility]   Exercise Vital Sign    Days of Exercise per Week: 0 days    Minutes of Exercise per Session: 0 min  Stress: Stress Concern Present (11/04/2021)   Received from [facility], [facility]   [employer] [facility] - Occupational Stress Questionnaire    Feeling of Stress : Very much  Social Connections: Socially Integrated (11/04/2021)   Received from [facility], [facility]   Social Connection and Isolation Panel [NHANES]    Frequency of Communication with Friends and Family: Three times a week    Frequency of Social Gatherings with Friends and Family: Not on file    Attends Religious Services: More than 4 times per year    Active Member of Clubs or Organizations: Yes    Attends Engineer, structural: More than 4 times per year    Marital Status: Living with partner   Intimate Partner Violence: Not on file    Outpatient Medications Prior to Visit  Medication Sig Dispense Refill   atropine 1 % ophthalmic solution Place one drop into the right eye 2 (two) times daily for 14 days. Do not take tonight.  Dr. Loraine Grip will evaluate your post-operative status tomorrow and may make changes to the duration and dose of this medication.     sildenafil (VIAGRA) 50 MG tablet Take 1 tablet (50 mg total) by mouth daily as needed for erectile dysfunction. 10 tablet 3   atorvastatin (LIPITOR) 40 MG tablet Take 1 tablet (40 mg total) by mouth daily. 90 tablet 1   metFORMIN (GLUCOPHAGE-XR) 500 MG 24 hr tablet Take 2 tablets (1,000 mg total) by mouth 2 (two) times daily with a meal. 120 tablet 2   No facility-administered medications prior to visit.    No Known Allergies  ROS Review of Systems  Constitutional:  Negative for appetite change, chills, fatigue and fever.  HENT:  Negative for congestion, postnasal drip, rhinorrhea and sneezing.   Respiratory:  Negative for cough, shortness of breath and wheezing.   Cardiovascular:  Negative for chest pain, palpitations and leg swelling.  Gastrointestinal:  Negative for abdominal pain, constipation, nausea and vomiting.  Genitourinary:  Negative for difficulty urinating, dysuria, flank pain and frequency.  Musculoskeletal:  Negative for arthralgias, back pain, joint swelling and myalgias.  Skin:  Negative for color change, pallor, rash and wound.  Neurological:  Negative for dizziness, facial asymmetry, weakness, numbness and headaches.  Psychiatric/Behavioral:  Negative for behavioral problems, confusion, self-injury and suicidal ideas.       Objective:    Physical Exam Vitals and nursing note reviewed.  Constitutional:      General: He is not in acute distress.    Appearance: Normal appearance. He is not ill-appearing, toxic-appearing or diaphoretic.  HENT:     Mouth/Throat:     Mouth: Mucous membranes are moist.      Pharynx: Oropharynx is clear. No oropharyngeal exudate or posterior oropharyngeal erythema.  Cardiovascular:     Rate and Rhythm: Normal rate and regular rhythm.     Pulses: Normal pulses.     Heart sounds: Normal heart sounds. No murmur heard.    No friction rub. No gallop.  Pulmonary:     Effort: Pulmonary effort is normal. No respiratory distress.     Breath sounds: Normal breath sounds. No stridor. No wheezing, rhonchi or rales.  Chest:     Chest wall: No tenderness.  Abdominal:     General: There is no distension.     Palpations: Abdomen is soft.     Tenderness: There is no abdominal tenderness. There is no right CVA tenderness, left CVA tenderness or guarding.  Musculoskeletal:  General: No swelling, tenderness, deformity or signs of injury.     Right lower leg: No edema.     Left lower leg: No edema.  Skin:    General: Skin is warm and dry.     Capillary Refill: Capillary refill takes less than 2 seconds.     Coloration: Skin is not jaundiced or pale.     Findings: No bruising, erythema or lesion.  Neurological:     Mental Status: He is alert and oriented to person, place, and time.     Motor: No weakness.     Coordination: Coordination normal.     Gait: Gait normal.  Psychiatric:        Mood and Affect: Mood normal.        Behavior: Behavior normal.        Thought Content: Thought content normal.        Judgment: Judgment normal.     BP 132/82   Pulse 64   Wt 147 lb 14.4 oz (67.1 kg)   SpO2 100%   BMI 23.16 kg/m  Wt Readings from Last 3 Encounters:  07/26/23 147 lb 14.4 oz (67.1 kg)  04/23/23 159 lb (72.1 kg)  01/12/23 168 lb 6.4 oz (76.4 kg)    Lab Results  Component Value Date   TSH 0.551 08/26/2017   Lab Results  Component Value Date   WBC 4.9 01/12/2023   HGB 12.1 (L) 01/12/2023   HCT 41.6 01/12/2023   MCV 76 (L) 01/12/2023   PLT 232 01/12/2023   Lab Results  Component Value Date   NA 136 01/12/2023   K 4.6 01/12/2023   CO2 21  01/12/2023   GLUCOSE 427 (H) 01/12/2023   BUN 7 01/12/2023   CREATININE 1.01 01/12/2023   BILITOT 0.3 01/12/2023   ALKPHOS 102 01/12/2023   AST 25 01/12/2023   ALT 50 (H) 01/12/2023   PROT 7.1 01/12/2023   ALBUMIN 4.3 01/12/2023   CALCIUM 9.5 01/12/2023   ANIONGAP 13 12/16/2017   EGFR 93 01/12/2023   Lab Results  Component Value Date   CHOL 101 04/23/2023   Lab Results  Component Value Date   HDL 37 (L) 04/23/2023   Lab Results  Component Value Date   LDLCALC 48 04/23/2023   Lab Results  Component Value Date   TRIG 80 04/23/2023   Lab Results  Component Value Date   CHOLHDL 2.7 04/23/2023   Lab Results  Component Value Date   HGBA1C 7.5 (A) 07/26/2023      Assessment & Plan:   Problem List Items Addressed This Visit       Cardiovascular and Mediastinum   Hypertension    BP Readings from Last 3 Encounters:  07/26/23 132/82  04/23/23 128/86  01/12/23 129/80   HTN Fairly Controlled . Will start lisinopril 2.5 mg daily at next visit if blood pressure is greater than 130/80 Discussed DASH diet and dietary sodium restrictions Continue to increase dietary efforts and exercise.  Follow-up in 3 months        Relevant Medications   atorvastatin (LIPITOR) 40 MG tablet     Endocrine   Hyperlipidemia associated with type 2 diabetes mellitus (HCC) - Primary    Lab Results  Component Value Date   HGBA1C 7.5 (A) 07/26/2023  A1c is much improved from 11.7 3 months ago Patient congratulated on his efforts at getting his diabetes under control A1c goal is less than 7 Patient would like to continue current medication and make  more lifestyle changes instead of starting a new medication Will continue metformin 1000 mg twice daily for now Patient counseled on low-carb modified diet Encouraged to engage in regular moderate exercise at least [place] 50 minutes weekly Will add on a GLP-1 at next visit if A1c is not less than 7 Follow-up in 3 months Continue  atorvastatin 40 mg daily for hyperlipidemia Lab Results  Component Value Date   CHOL 101 04/23/2023   HDL 37 (L) 04/23/2023   LDLCALC 48 04/23/2023   TRIG 80 04/23/2023   CHOLHDL 2.7 04/23/2023          Relevant Medications   metFORMIN (GLUCOPHAGE-XR) 500 MG 24 hr tablet   atorvastatin (LIPITOR) 40 MG tablet   Other Relevant Orders   POCT glycosylated hemoglobin (Hb A1C) (Completed)     Other   Need for influenza vaccination    Patient educated on CDC recommendation for the influenza vaccine. Verbal consent was obtained from the patient, vaccine administered by nurse, no sign of adverse reactions noted at this time. Patient education on arm soreness and use of tylenol  for this patient  was discussed. Patient educated on the signs and symptoms of adverse effect and advise to contact the office if they occur. Vaccine information sheet given to patient.        Relevant Orders   Flu vaccine trivalent PF, 6mos and older(Flulaval,Afluria,Fluarix,Fluzone) (Completed)    Meds ordered this encounter  Medications   metFORMIN (GLUCOPHAGE-XR) 500 MG 24 hr tablet    Sig: Take 2 tablets (1,000 mg total) by mouth 2 (two) times daily with a meal.    Dispense:  120 tablet    Refill:  2   atorvastatin (LIPITOR) 40 MG tablet    Sig: Take 1 tablet (40 mg total) by mouth daily.    Dispense:  90 tablet    Refill:  1    Follow-up: Return in about 3 months (around 10/26/2023) for CPE.    Donell Beers, FNP

## 2023-07-26 NOTE — Assessment & Plan Note (Signed)
Patient educated on CDC recommendation for the influenza vaccine. Verbal consent was obtained from the patient, vaccine administered by nurse, no sign of adverse reactions noted at this time. Patient education on arm soreness and use of tylenol  for this patient  was discussed. Patient educated on the signs and symptoms of adverse effect and advise to contact the office if they occur. Vaccine information sheet given to patient.  

## 2023-07-26 NOTE — Assessment & Plan Note (Addendum)
Lab Results  Component Value Date   HGBA1C 7.5 (A) 07/26/2023  A1c is much improved from 11.7 3 months ago Patient congratulated on his efforts at getting his diabetes under control A1c goal is less than 7 Patient would like to continue current medication and make more lifestyle changes instead of starting a new medication Will continue metformin 1000 mg twice daily for now Patient counseled on low-carb modified diet Encouraged to engage in regular moderate exercise at least [place] 50 minutes weekly Will add on a GLP-1 at next visit if A1c is not less than 7 Follow-up in 3 months Continue atorvastatin 40 mg daily for hyperlipidemia Lab Results  Component Value Date   CHOL 101 04/23/2023   HDL 37 (L) 04/23/2023   LDLCALC 48 04/23/2023   TRIG 80 04/23/2023   CHOLHDL 2.7 04/23/2023

## 2023-08-24 ENCOUNTER — Ambulatory Visit: Payer: Medicaid Other | Admitting: Dietician

## 2023-08-26 ENCOUNTER — Telehealth: Payer: Self-pay | Admitting: Pharmacist

## 2023-08-26 ENCOUNTER — Other Ambulatory Visit: Payer: Self-pay

## 2023-08-26 NOTE — Progress Notes (Signed)
Attempted to contact patient for scheduled appointment for medication management. Left HIPAA compliant message for patient to return my call at their convenience.   A1c improved significantly. Will hold off on scheduling follow up before next PCP visit at this time unless patient reports concerns.   Catie Eppie Gibson, PharmD, BCACP, CPP Clinical Pharmacist [facility] [phone]

## 2023-10-25 ENCOUNTER — Ambulatory Visit: Payer: Self-pay | Admitting: Nurse Practitioner

## 2023-11-12 ENCOUNTER — Ambulatory Visit (INDEPENDENT_AMBULATORY_CARE_PROVIDER_SITE_OTHER): Payer: Self-pay | Admitting: Nurse Practitioner

## 2023-11-12 ENCOUNTER — Encounter: Payer: Self-pay | Admitting: Nurse Practitioner

## 2023-11-12 VITALS — BP 135/76 | HR 77 | Wt 158.0 lb

## 2023-11-12 DIAGNOSIS — Z1329 Encounter for screening for other suspected endocrine disorder: Secondary | ICD-10-CM | POA: Diagnosis not present

## 2023-11-12 DIAGNOSIS — Z1211 Encounter for screening for malignant neoplasm of colon: Secondary | ICD-10-CM

## 2023-11-12 DIAGNOSIS — E785 Hyperlipidemia, unspecified: Secondary | ICD-10-CM

## 2023-11-12 DIAGNOSIS — E1169 Type 2 diabetes mellitus with other specified complication: Secondary | ICD-10-CM | POA: Diagnosis not present

## 2023-11-12 DIAGNOSIS — Z130 Encounter for screening for diseases of the blood and blood-forming organs and certain disorders involving the immune mechanism: Secondary | ICD-10-CM

## 2023-11-12 DIAGNOSIS — Z1321 Encounter for screening for nutritional disorder: Secondary | ICD-10-CM

## 2023-11-12 DIAGNOSIS — R030 Elevated blood-pressure reading, without diagnosis of hypertension: Secondary | ICD-10-CM

## 2023-11-12 DIAGNOSIS — Z0000 Encounter for general adult medical examination without abnormal findings: Secondary | ICD-10-CM | POA: Diagnosis not present

## 2023-11-12 DIAGNOSIS — Z13228 Encounter for screening for other metabolic disorders: Secondary | ICD-10-CM

## 2023-11-12 DIAGNOSIS — I10 Essential (primary) hypertension: Secondary | ICD-10-CM

## 2023-11-12 LAB — POCT GLYCOSYLATED HEMOGLOBIN (HGB A1C): Hemoglobin A1C: 10.5 % — AB (ref 4.0–5.6)

## 2023-11-12 MED ORDER — METFORMIN HCL ER 500 MG PO TB24
1000.0000 mg | ORAL_TABLET | Freq: Two times a day (BID) | ORAL | 1 refills | Status: DC
Start: 2023-11-12 — End: 2023-12-14

## 2023-11-12 MED ORDER — LISINOPRIL 2.5 MG PO TABS: 2.5000 mg | ORAL_TABLET | Freq: Every day | ORAL | 1 refills | Status: DC

## 2023-11-12 MED ORDER — EMPAGLIFLOZIN 10 MG PO TABS: 10.0000 mg | ORAL_TABLET | Freq: Every day | ORAL | 0 refills | Status: DC

## 2023-11-12 NOTE — Progress Notes (Signed)
 Complete physical exam  Patient: Devon Pace   DOB: 13-Jan-1976   48 y.o. Male  MRN: [id number]  Subjective:    Chief Complaint  Patient presents with   Annual Exam    fasting    Devon Pace is a 48 y.o. male  has a past medical history of Blindness of left eye, Diabetes mellitus without complication (HCC), Glaucoma of both eyes, Hyperlipidemia, and Tobacco dependence. who presents today for a complete physical exam. He reports consuming a low carb diet but has been eating a lots of frozen foods  now, thinks that this might have contributed to his uncontrolled type 2 diabetes, does walking exercises 30 minutes daily. He generally feels well. He reports sleeping well.   Type 2 diabetes.  Currently on metformin 1000 mg twice daily.  Patient denies polyphagia polyuria polydipsia, takes atorvastatin 40 mg daily for hyperlipidemia.  Has upcoming appointment with ophthalmologist in May  Hypertension.  Currently not on medication was on lisinopril 2.5 mg daily in the past.  He denies shortness of breath, edema, chest pain    Most recent fall risk assessment:    04/23/2023    1:08 PM  Fall Risk   Falls in the past year? 0  Number falls in past yr: 0  Injury with Fall? 0  Risk for fall due to : No Fall Risks     Most recent depression screenings:    11/12/2023    1:41 PM 07/26/2023    1:55 PM  PHQ 2/9 Scores  PHQ - 2 Score 0 0        Patient Care Team: Donell Beers, FNP as PCP - General (Nurse Practitioner)   Outpatient Medications Prior to Visit  Medication Sig   atorvastatin (LIPITOR) 40 MG tablet Take 1 tablet (40 mg total) by mouth daily.   atropine 1 % ophthalmic solution Place one drop into the right eye 2 (two) times daily for 14 days. Do not take tonight.  Dr. Loraine Grip will evaluate your post-operative status tomorrow and may make changes to the duration and dose of this medication.   sildenafil (VIAGRA) 50 MG tablet Take 1 tablet (50 mg total) by mouth daily as  needed for erectile dysfunction.   [DISCONTINUED] metFORMIN (GLUCOPHAGE-XR) 500 MG 24 hr tablet Take 2 tablets (1,000 mg total) by mouth 2 (two) times daily with a meal.   No facility-administered medications prior to visit.    Review of Systems  Constitutional:  Negative for appetite change, chills, fatigue and fever.  HENT:  Negative for congestion, postnasal drip, rhinorrhea and sneezing.   Eyes:  Positive for visual disturbance. Negative for pain, discharge and itching.  Respiratory:  Negative for cough, shortness of breath and wheezing.   Cardiovascular:  Negative for chest pain, palpitations and leg swelling.  Gastrointestinal:  Negative for abdominal pain, constipation, nausea and vomiting.  Endocrine: Negative for cold intolerance and heat intolerance.  Genitourinary:  Negative for difficulty urinating, dysuria, flank pain and frequency.  Musculoskeletal:  Negative for arthralgias, back pain, joint swelling and myalgias.  Skin:  Negative for color change, pallor, rash and wound.  Allergic/Immunologic: Negative for food allergies and immunocompromised state.  Neurological:  Negative for dizziness, facial asymmetry, weakness, numbness and headaches.  Psychiatric/Behavioral:  Negative for behavioral problems, confusion, self-injury and suicidal ideas.        Objective:     BP 135/76   Pulse 77   Wt 158 lb (71.7 kg)   SpO2 100%   BMI  24.75 kg/m    Physical Exam Vitals and nursing note reviewed.  Constitutional:      General: He is not in acute distress.    Appearance: Normal appearance. He is not ill-appearing, toxic-appearing or diaphoretic.  HENT:     Right Ear: Tympanic membrane, ear canal and external ear normal. There is no impacted cerumen.     Left Ear: Tympanic membrane, ear canal and external ear normal. There is no impacted cerumen.     Nose: Nose normal. No congestion or rhinorrhea.     Mouth/Throat:     Mouth: Mucous membranes are moist.     Pharynx:  Oropharynx is clear. No oropharyngeal exudate or posterior oropharyngeal erythema.  Eyes:     Conjunctiva/sclera: Conjunctivae normal.     Comments: Visually impaired  Neck:     Vascular: No carotid bruit.  Cardiovascular:     Rate and Rhythm: Normal rate and regular rhythm.     Pulses: Normal pulses.     Heart sounds: Normal heart sounds. No murmur heard.    No friction rub. No gallop.  Pulmonary:     Effort: Pulmonary effort is normal. No respiratory distress.     Breath sounds: Normal breath sounds. No stridor. No wheezing, rhonchi or rales.  Chest:     Chest wall: No tenderness.  Abdominal:     General: Bowel sounds are normal. There is no distension.     Palpations: Abdomen is soft. There is no mass.     Tenderness: There is no abdominal tenderness. There is no right CVA tenderness, left CVA tenderness, guarding or rebound.     Hernia: No hernia is present.  Musculoskeletal:        General: No swelling, tenderness, deformity or signs of injury.     Cervical back: Normal range of motion and neck supple. No rigidity or tenderness.     Right lower leg: No edema.     Left lower leg: No edema.  Lymphadenopathy:     Cervical: No cervical adenopathy.  Skin:    General: Skin is warm and dry.     Capillary Refill: Capillary refill takes less than 2 seconds.     Coloration: Skin is not jaundiced or pale.     Findings: No bruising, erythema, lesion or rash.  Neurological:     Mental Status: He is alert and oriented to person, place, and time.     Cranial Nerves: No cranial nerve deficit.     Sensory: No sensory deficit.     Motor: No weakness.     Coordination: Coordination normal.     Gait: Gait normal.     Deep Tendon Reflexes: Reflexes normal.  Psychiatric:        Mood and Affect: Mood normal.        Behavior: Behavior normal.        Thought Content: Thought content normal.        Judgment: Judgment normal.     Results for orders placed or performed in visit on 11/12/23   POCT glycosylated hemoglobin (Hb A1C)  Result Value Ref Range   Hemoglobin A1C 10.5 (A) 4.0 - 5.6 %   HbA1c POC (<> result, manual entry)     HbA1c, POC (prediabetic range)     HbA1c, POC (controlled diabetic range)         Assessment & Plan:    Routine Health Maintenance and Physical Exam  Immunization History  Administered Date(s) Administered   Influenza, Seasonal, Injecte, Preservative  Fre 07/26/2023   Influenza,inj,Quad PF,6+ Mos 07/21/2022   PNEUMOCOCCAL CONJUGATE-20 12/08/2021   Pneumococcal Polysaccharide-23 08/18/2016   Tdap 04/18/2014, 11/16/2016    Health Maintenance  Topic Date Due   Hepatitis C Screening  Never done   OPHTHALMOLOGY EXAM  01/28/2019   COVID-19 Vaccine (1 - 2024-25 season) Never done   Diabetic kidney evaluation - eGFR measurement  01/12/2024   Diabetic kidney evaluation - Urine ACR  01/12/2024   FOOT EXAM  01/12/2024   HEMOGLOBIN A1C  05/14/2024   Fecal DNA (Cologuard)  07/29/2025   DTaP/Tdap/Td (3 - Td or Tdap) 11/17/2026   Pneumococcal Vaccine 45-27 Years old  Completed   INFLUENZA VACCINE  Completed   HIV Screening  Addressed   HPV VACCINES  Aged Out    Discussed health benefits of physical activity, and encouraged him to engage in regular exercise appropriate for his age and condition.  Problem List Items Addressed This Visit       Cardiovascular and Mediastinum   RESOLVED: High blood pressure   Relevant Medications   lisinopril (ZESTRIL) 2.5 MG tablet     Endocrine   Hyperlipidemia associated with type 2 diabetes mellitus (HCC)   Lab Results  Component Value Date   HGBA1C 10.5 (A) 11/12/2023    Continue metformin 1000 mg twice daily Start Jardiance 10 mg daily Patient counseled on low-carb diet, CBG goals provided. He is interested in diabetic nutrition education but would like this to be done virtually instead of in office.  Their phone number provided to the patient and encouraged to call them to schedule an  appointment Patient encouraged to get his yearly eye exam completed Continue atorvastatin 40 mg daily for hyperlipidemia Follow-up in 4 weeks  Lab Results  Component Value Date   CHOL 101 04/23/2023   HDL 37 (L) 04/23/2023   LDLCALC 48 04/23/2023   TRIG 80 04/23/2023   CHOLHDL 2.7 04/23/2023          Relevant Medications   empagliflozin (JARDIANCE) 10 MG TABS tablet   metFORMIN (GLUCOPHAGE-XR) 500 MG 24 hr tablet   lisinopril (ZESTRIL) 2.5 MG tablet   Other Relevant Orders   POCT glycosylated hemoglobin (Hb A1C) (Completed)   Lipid panel     Other   Annual physical exam - Primary   Annual exam as documented.  Counseling done include healthy lifestyle involving committing to 150 minutes of exercise per week, heart healthy diet, and attaining healthy weight. The importance of adequate sleep also discussed.  Regular use of seat belt and home safety were also discussed . Changes in health habits are decided on by patient with goals and time frames set for achieving them. Immunization and cancer screening  needs are specifically addressed at this visit.    Up-to-date with his vaccines  - PSA - CBC - CMP14+EGFR - Lipid panel - Hepatitis C antibody   Screening for colon cancer  - Cologuard       Other Visit Diagnoses       Screening for endocrine, nutritional, metabolic and immunity disorder       Relevant Orders   PSA   CBC   CMP14+EGFR   Lipid panel   Hepatitis C antibody     Screening for colon cancer       Relevant Orders   Cologuard      Return in about 4 weeks (around 12/10/2023) for DM.     Donell Beers, FNP

## 2023-11-12 NOTE — Assessment & Plan Note (Signed)
 BP Readings from Last 3 Encounters:  11/12/23 135/76  07/26/23 132/82  04/23/23 128/86  Restart lisinopril 2.5 mg daily Blood pressure goal is less than 130/80 Continue current medications. No changes in management. Discussed DASH diet and dietary sodium restrictions Continue to increase dietary efforts and exercise.  Follow-up in 4 weeks

## 2023-11-12 NOTE — Patient Instructions (Addendum)
  Goal for fasting blood sugar ranges from 80 to 120 and 2 hours after any meal or at bedtime should be between 130 to 170.   1. Hyperlipidemia associated with type 2 diabetes mellitus (HCC) (Primary)  - POCT glycosylated hemoglobin (Hb A1C) - Lipid panel - empagliflozin (JARDIANCE) 10 MG TABS tablet; Take 1 tablet (10 mg total) by mouth daily before breakfast.  Dispense: 90 tablet; Refill: 0 - metFORMIN (GLUCOPHAGE-XR) 500 MG 24 hr tablet; Take 2 tablets (1,000 mg total) by mouth 2 (two) times daily with a meal.  Dispense: 180 tablet; Refill: 1  2. Screening for endocrine, nutritional, metabolic and immunity disorder  - PSA - CBC - CMP14+EGFR - Lipid panel  3. Screening for colon cancer  - Cologuard  4. Elevated blood pressure reading  - lisinopril (ZESTRIL) 2.5 MG tablet; Take 1 tablet (2.5 mg total) by mouth daily.  Dispense: 90 tablet; Refill: 1   It is important that you exercise regularly at least 30 minutes 5 times a week as tolerated  Think about what you will eat, plan ahead. Choose " clean, green, fresh or frozen" over canned, processed or packaged foods which are more sugary, salty and fatty. 70 to 75% of food eaten should be vegetables and fruit. Three meals at set times with snacks allowed between meals, but they must be fruit or vegetables. Aim to eat over a 12 hour period , example 7 am to 7 pm, and STOP after  your last meal of the day. Drink water,generally about 64 ounces per day, no other drink is as healthy. Fruit juice is best enjoyed in a healthy way, by EATING the fruit.  Thanks for choosing [facility] we consider it a privelige to serve you.

## 2023-11-12 NOTE — Assessment & Plan Note (Signed)
 Annual exam as documented.  Counseling done include healthy lifestyle involving committing to 150 minutes of exercise per week, heart healthy diet, and attaining healthy weight. The importance of adequate sleep also discussed.  Regular use of seat belt and home safety were also discussed . Changes in health habits are decided on by patient with goals and time frames set for achieving them. Immunization and cancer screening  needs are specifically addressed at this visit.    Up-to-date with his vaccines  - PSA - CBC - CMP14+EGFR - Lipid panel - Hepatitis C antibody   Screening for colon cancer  - Cologuard

## 2023-11-12 NOTE — Assessment & Plan Note (Addendum)
 Lab Results  Component Value Date   HGBA1C 10.5 (A) 11/12/2023    Continue metformin 1000 mg twice daily Start Jardiance 10 mg daily Patient counseled on low-carb diet, CBG goals provided. He is interested in diabetic nutrition education but would like this to be done virtually instead of in office.  Their phone number provided to the patient and encouraged to call them to schedule an appointment Patient encouraged to get his yearly eye exam completed Continue atorvastatin 40 mg daily for hyperlipidemia Follow-up in 4 weeks  Lab Results  Component Value Date   CHOL 101 04/23/2023   HDL 37 (L) 04/23/2023   LDLCALC 48 04/23/2023   TRIG 80 04/23/2023   CHOLHDL 2.7 04/23/2023

## 2023-11-13 LAB — LIPID PANEL
Chol/HDL Ratio: 2.5 ratio (ref 0.0–5.0)
Cholesterol, Total: 88 mg/dL — ABNORMAL LOW (ref 100–199)
HDL: 35 mg/dL — ABNORMAL LOW (ref >39)
LDL Chol Calc (NIH): 36 mg/dL (ref 0–99)
Triglycerides: 85 mg/dL (ref 0–149)
VLDL Cholesterol Cal: 17 mg/dL (ref 5–40)

## 2023-11-13 LAB — CBC
Hematocrit: 42 % (ref 37.5–51.0)
Hemoglobin: 12.2 g/dL — ABNORMAL LOW (ref 13.0–17.7)
MCH: 21.7 pg — ABNORMAL LOW (ref 26.6–33.0)
MCHC: 29 g/dL — ABNORMAL LOW (ref 31.5–35.7)
MCV: 75 fL — ABNORMAL LOW (ref 79–97)
Platelets: 264 10*3/uL (ref 150–450)
RBC: 5.62 x10E6/uL (ref 4.14–5.80)
RDW: 11.7 % (ref 11.6–15.4)
WBC: 4.9 10*3/uL (ref 3.4–10.8)

## 2023-11-13 LAB — PSA: Prostate Specific Ag, Serum: 1.1 ng/mL (ref 0.0–4.0)

## 2023-11-13 LAB — CMP14+EGFR
ALT: 31 IU/L (ref 0–44)
AST: 17 IU/L (ref 0–40)
Albumin: 4.5 g/dL (ref 4.1–5.1)
Alkaline Phosphatase: 119 IU/L (ref 44–121)
BUN/Creatinine Ratio: 8 — ABNORMAL LOW (ref 9–20)
BUN: 8 mg/dL (ref 6–24)
Bilirubin Total: 0.7 mg/dL (ref 0.0–1.2)
CO2: 23 mmol/L (ref 20–29)
Calcium: 9.7 mg/dL (ref 8.7–10.2)
Chloride: 101 mmol/L (ref 96–106)
Creatinine, Ser: 0.97 mg/dL (ref 0.76–1.27)
Globulin, Total: 2.8 g/dL (ref 1.5–4.5)
Glucose: 269 mg/dL — ABNORMAL HIGH (ref 70–99)
Potassium: 4.8 mmol/L (ref 3.5–5.2)
Sodium: 136 mmol/L (ref 134–144)
Total Protein: 7.3 g/dL (ref 6.0–8.5)
eGFR: 98 mL/min/{1.73_m2} (ref >59)

## 2023-11-13 LAB — HEPATITIS C ANTIBODY: Hep C Virus Ab: NONREACTIVE

## 2023-11-16 ENCOUNTER — Telehealth: Payer: Self-pay

## 2023-11-16 NOTE — Telephone Encounter (Signed)
 Pharmacy Patient Advocate Encounter   Received notification from CoverMyMeds that prior authorization for JARDIANCE is required/requested.   Insurance verification completed.   The patient is insured through [facility] MEDICAID .   Per test claim: PA required; PA submitted to above mentioned insurance via CoverMyMeds Key/confirmation #/EOC [id number] Status is pending

## 2023-11-18 ENCOUNTER — Other Ambulatory Visit: Payer: Self-pay

## 2023-11-18 NOTE — Telephone Encounter (Signed)
 Pharmacy Patient Advocate Encounter  Received notification from [facility] MEDICAID that Prior Authorization for JARDIANCE has been APPROVED from 11/16/2023 to 11/15/2024   PA #/Case ID/Reference #: [id number]  MyChart message sent to patient.

## 2023-12-13 ENCOUNTER — Ambulatory Visit (INDEPENDENT_AMBULATORY_CARE_PROVIDER_SITE_OTHER): Payer: Self-pay | Admitting: Nurse Practitioner

## 2023-12-13 ENCOUNTER — Encounter: Payer: Self-pay | Admitting: Nurse Practitioner

## 2023-12-13 VITALS — BP 120/75 | HR 71 | Temp 97.0°F | Wt 161.0 lb

## 2023-12-13 DIAGNOSIS — E1169 Type 2 diabetes mellitus with other specified complication: Secondary | ICD-10-CM | POA: Diagnosis not present

## 2023-12-13 DIAGNOSIS — I10 Essential (primary) hypertension: Secondary | ICD-10-CM

## 2023-12-13 DIAGNOSIS — E785 Hyperlipidemia, unspecified: Secondary | ICD-10-CM | POA: Diagnosis not present

## 2023-12-13 MED ORDER — ATORVASTATIN CALCIUM 40 MG PO TABS: 40.0000 mg | ORAL_TABLET | Freq: Every day | ORAL | 1 refills | Status: DC

## 2023-12-13 NOTE — Progress Notes (Signed)
 Established Patient Office Visit  Subjective:  Patient ID: Dyron Kawano, male    DOB: 10/07/1975  Age: 48 y.o. MRN: [id number]  CC:  Chief Complaint  Patient presents with   Diabetes    HPI Harjot Dibello is a 48 y.o. male  has a past medical history of Blindness of left eye, Diabetes mellitus without complication (HCC), Glaucoma of both eyes, Hyperlipidemia, and Tobacco dependence.  Patient presents for follow-up for type 2 diabetes  Uncontrolled type 2 diabetes.  Currently on metformin 1000 mg twice daily, 10 mg daily was ordered at his previous visit but the patient was not able to confirm if he has been taking the medication or not.  Stated that he has been following a low-carb diet, he is not interested in diabetic nutrition education class.  No complaints of polyuria polyphagia polydipsia.  Does not check blood sugar is random blood sugar reading the last time he checked was in the 200s.  Has upcoming appointment with cardiologist next month  He has received the Cologuard testing kits but has not completed the test, patient encouraged to complete the test as ordered   Past Medical History:  Diagnosis Date   Blindness of left eye    Diabetes mellitus without complication (HCC)    Glaucoma of both eyes    Hyperlipidemia    Tobacco dependence     Past Surgical History:  Procedure Laterality Date   EYE SURGERY     SHOULDER ARTHROSCOPY WITH ROTATOR CUFF REPAIR     right shoulder 09/2017     Family History  Problem Relation Age of Onset   Diabetes Mother    Breast cancer Mother    Heart disease Father    Prostate cancer Paternal Uncle    Diabetes Maternal Grandmother    Prostate cancer Paternal Grandfather    Colon cancer Neg Hx     Social History   Socioeconomic History   Marital status: Single    Spouse name: Not on file   Number of children: Not on file   Years of education: Not on file   Highest education level: Not on file  Occupational History   Not on file   Tobacco Use   Smoking status: Former    Current packs/day: 0.00    Types: Cigarettes    Quit date: 08/2022    Years since quitting: 1.3   Smokeless tobacco: Never  Vaping Use   Vaping status: Some Days  Substance and Sexual Activity   Alcohol use: Yes    Comment: occ   Drug use: Not Currently    Types: Marijuana   Sexual activity: Yes  Other Topics Concern   Not on file  Social History Narrative   Lives with his fiance   Social Drivers of Health   Financial Resource Strain: High Risk (11/04/2021)   Received from [facility], [facility]   Overall Financial Resource Strain (CARDIA)    Difficulty of Paying Living Expenses: Very hard  Food Insecurity: Food Insecurity Present (11/04/2021)   Received from [facility], [facility]   Hunger Vital Sign    Worried About Running Out of Food in the Last Year: Often true    Ran Out of Food in the Last Year: Often true  Transportation Needs: Unmet Transportation Needs (11/04/2021)   Received from [facility], [facility]   PRAPARE - Transportation    In the past 12 months, has  lack of transportation kept you from medical appointments or from getting medications?: No    Lack of Transportation (Non-Medical): Yes  Physical Activity: Inactive (11/04/2021)   Received from [facility], [facility]   Exercise Vital Sign    Days of Exercise per Week: 0 days    Minutes of Exercise per Session: 0 min  Stress: Stress Concern Present (11/04/2021)   Received from [facility], [facility]   [employer] of Occupational Health - Occupational Stress Questionnaire    Feeling of Stress : Very much  Social Connections: Socially Integrated (11/04/2021)   Received from [facility], [facility]   Social Connection and Isolation Panel  [NHANES]    Frequency of Communication with Friends and Family: Three times a week    Frequency of Social Gatherings with Friends and Family: Not on file    Attends Religious Services: More than 4 times per year    Active Member of Clubs or Organizations: Yes    Attends Engineer, structural: More than 4 times per year    Marital Status: Living with partner  Intimate Partner Violence: Not on file    Outpatient Medications Prior to Visit  Medication Sig Dispense Refill   empagliflozin (JARDIANCE) 10 MG TABS tablet Take 1 tablet (10 mg total) by mouth daily before breakfast. 90 tablet 0   lisinopril (ZESTRIL) 2.5 MG tablet Take 1 tablet (2.5 mg total) by mouth daily. 90 tablet 1   metFORMIN (GLUCOPHAGE-XR) 500 MG 24 hr tablet Take 2 tablets (1,000 mg total) by mouth 2 (two) times daily with a meal. 180 tablet 1   sildenafil (VIAGRA) 50 MG tablet Take 1 tablet (50 mg total) by mouth daily as needed for erectile dysfunction. 10 tablet 3   atorvastatin (LIPITOR) 40 MG tablet Take 1 tablet (40 mg total) by mouth daily. 90 tablet 1   atropine 1 % ophthalmic solution Place one drop into the right eye 2 (two) times daily for 14 days. Do not take tonight.  Dr. Loraine Grip will evaluate your post-operative status tomorrow and may make changes to the duration and dose of this medication. (Patient not taking: Reported on 12/13/2023)     No facility-administered medications prior to visit.    No Known Allergies  ROS Review of Systems  Constitutional:  Negative for appetite change, chills, fatigue and fever.  HENT:  Negative for congestion, postnasal drip, rhinorrhea and sneezing.   Respiratory:  Negative for cough, shortness of breath and wheezing.   Cardiovascular:  Negative for chest pain, palpitations and leg swelling.  Gastrointestinal:  Negative for abdominal pain, constipation, nausea and vomiting.  Genitourinary:  Negative for difficulty urinating, dysuria, flank pain and frequency.   Musculoskeletal:  Negative for arthralgias, back pain, joint swelling and myalgias.  Skin:  Negative for color change, pallor, rash and wound.  Neurological:  Negative for dizziness, facial asymmetry, weakness, numbness and headaches.  Psychiatric/Behavioral:  Negative for behavioral problems, confusion, self-injury and suicidal ideas.       Objective:    Physical Exam Vitals and nursing note reviewed.  Constitutional:      General: He is not in acute distress.    Appearance: Normal appearance. He is not ill-appearing, toxic-appearing or diaphoretic.  HENT:     Mouth/Throat:     Mouth: Mucous membranes are moist.     Pharynx: Oropharynx is clear. No oropharyngeal exudate or posterior oropharyngeal erythema.  Cardiovascular:  Rate and Rhythm: Normal rate and regular rhythm.     Pulses: Normal pulses.     Heart sounds: Normal heart sounds. No murmur heard.    No friction rub. No gallop.  Pulmonary:     Effort: Pulmonary effort is normal. No respiratory distress.     Breath sounds: Normal breath sounds. No stridor. No wheezing, rhonchi or rales.  Chest:     Chest wall: No tenderness.  Abdominal:     General: There is no distension.     Palpations: Abdomen is soft.     Tenderness: There is no abdominal tenderness. There is no right CVA tenderness, left CVA tenderness or guarding.  Musculoskeletal:        General: No swelling, tenderness, deformity or signs of injury.     Right lower leg: No edema.     Left lower leg: No edema.  Skin:    General: Skin is warm and dry.     Capillary Refill: Capillary refill takes less than 2 seconds.     Coloration: Skin is not jaundiced or pale.     Findings: No bruising, erythema or lesion.  Neurological:     Mental Status: He is alert and oriented to person, place, and time.     Motor: No weakness.     Coordination: Coordination normal.     Gait: Gait normal.  Psychiatric:        Mood and Affect: Mood normal.        Behavior:  Behavior normal.        Thought Content: Thought content normal.        Judgment: Judgment normal.     BP 120/75   Pulse 71   Temp (!) 97 F (36.1 C)   Wt 161 lb (73 kg)   SpO2 98%   BMI 25.22 kg/m  Wt Readings from Last 3 Encounters:  12/13/23 161 lb (73 kg)  11/12/23 158 lb (71.7 kg)  07/26/23 147 lb 14.4 oz (67.1 kg)    Lab Results  Component Value Date   TSH 0.551 08/26/2017   Lab Results  Component Value Date   WBC 4.9 11/12/2023   HGB 12.2 (L) 11/12/2023   HCT 42.0 11/12/2023   MCV 75 (L) 11/12/2023   PLT 264 11/12/2023   Lab Results  Component Value Date   NA 136 11/12/2023   K 4.8 11/12/2023   CO2 23 11/12/2023   GLUCOSE 269 (H) 11/12/2023   BUN 8 11/12/2023   CREATININE 0.97 11/12/2023   BILITOT 0.7 11/12/2023   ALKPHOS 119 11/12/2023   AST 17 11/12/2023   ALT 31 11/12/2023   PROT 7.3 11/12/2023   ALBUMIN 4.5 11/12/2023   CALCIUM 9.7 11/12/2023   ANIONGAP 13 12/16/2017   EGFR 98 11/12/2023   Lab Results  Component Value Date   CHOL 88 (L) 11/12/2023   Lab Results  Component Value Date   HDL 35 (L) 11/12/2023   Lab Results  Component Value Date   LDLCALC 36 11/12/2023   Lab Results  Component Value Date   TRIG 85 11/12/2023   Lab Results  Component Value Date   CHOLHDL 2.5 11/12/2023   Lab Results  Component Value Date   HGBA1C 10.5 (A) 11/12/2023      Assessment & Plan:   Problem List Items Addressed This Visit       Cardiovascular and Mediastinum   Hypertension   BP Readings from Last 3 Encounters:  12/13/23 120/75  11/12/23 135/76  07/26/23 132/82  Not sure if he has started taking lisinopril 2.5 mg daily or not Blood pressure is normal in the office today      Relevant Medications   atorvastatin (LIPITOR) 40 MG tablet     Endocrine   Hyperlipidemia associated with type 2 diabetes mellitus (HCC)   Lab Results  Component Value Date   HGBA1C 10.5 (A) 11/12/2023   Lab Results  Component Value Date   CHOL 88  (L) 11/12/2023   HDL 35 (L) 11/12/2023   LDLCALC 36 11/12/2023   TRIG 85 11/12/2023   CHOLHDL 2.5 11/12/2023     Continue metformin 1000 mg twice daily, encouraged to pick up prescription for Jardiance 10 mg daily and take as ordered Patient counseled on low-carb diet, CBG goals discussed Not interested in diabetic nutrition education class Need to get his diabetes under control discussed Follow-up in 3 months Will refer patient to the clinical pharmacist to assist with medication adherence Continue atorvastatin 40 mg daily for hyperlipidemia      Relevant Medications   atorvastatin (LIPITOR) 40 MG tablet   Other Relevant Orders   AMB Referral VBCI Care Management   Other Visit Diagnoses       Uncontrolled type 2 diabetes mellitus with hyperglycemia (HCC)    -  Primary   Relevant Medications   atorvastatin (LIPITOR) 40 MG tablet   Other Relevant Orders   Microalbumin/Creatinine Ratio, Urine       Meds ordered this encounter  Medications   atorvastatin (LIPITOR) 40 MG tablet    Sig: Take 1 tablet (40 mg total) by mouth daily.    Dispense:  90 tablet    Refill:  1    Follow-up: Return in about 3 months (around 03/13/2024) for DM.    Donell Beers, FNP

## 2023-12-13 NOTE — Assessment & Plan Note (Signed)
 BP Readings from Last 3 Encounters:  12/13/23 120/75  11/12/23 135/76  07/26/23 132/82  Not sure if he has started taking lisinopril 2.5 mg daily or not Blood pressure is normal in the office today

## 2023-12-13 NOTE — Patient Instructions (Signed)
 Goal for fasting blood sugar ranges from 80 to 120 and 2 hours after any meal or at bedtime should be between 130 to 170.   It is important that you exercise regularly at least 30 minutes 5 times a week as tolerated  Think about what you will eat, plan ahead. Choose " clean, green, fresh or frozen" over canned, processed or packaged foods which are more sugary, salty and fatty. 70 to 75% of food eaten should be vegetables and fruit. Three meals at set times with snacks allowed between meals, but they must be fruit or vegetables. Aim to eat over a 12 hour period , example 7 am to 7 pm, and STOP after  your last meal of the day. Drink water,generally about 64 ounces per day, no other drink is as healthy. Fruit juice is best enjoyed in a healthy way, by EATING the fruit.  Thanks for choosing [facility] we consider it a privelige to serve you.

## 2023-12-13 NOTE — Assessment & Plan Note (Signed)
 Lab Results  Component Value Date   HGBA1C 10.5 (A) 11/12/2023   Lab Results  Component Value Date   CHOL 88 (L) 11/12/2023   HDL 35 (L) 11/12/2023   LDLCALC 36 11/12/2023   TRIG 85 11/12/2023   CHOLHDL 2.5 11/12/2023     Continue metformin 1000 mg twice daily, encouraged to pick up prescription for Jardiance 10 mg daily and take as ordered Patient counseled on low-carb diet, CBG goals discussed Not interested in diabetic nutrition education class Need to get his diabetes under control discussed Follow-up in 3 months Will refer patient to the clinical pharmacist to assist with medication adherence Continue atorvastatin 40 mg daily for hyperlipidemia

## 2023-12-14 ENCOUNTER — Other Ambulatory Visit: Payer: Self-pay | Admitting: Nurse Practitioner

## 2023-12-14 DIAGNOSIS — E1169 Type 2 diabetes mellitus with other specified complication: Secondary | ICD-10-CM

## 2023-12-15 LAB — MICROALBUMIN / CREATININE URINE RATIO
Creatinine, Urine: 162 mg/dL
Microalb/Creat Ratio: 16 mg/g{creat} (ref 0–29)
Microalbumin, Urine: 25.8 ug/mL

## 2023-12-16 ENCOUNTER — Telehealth: Payer: Self-pay | Admitting: *Deleted

## 2023-12-16 NOTE — Progress Notes (Signed)
 Care Guide Pharmacy Note  12/16/2023 Name: Estuardo Frisbee MRN: [id number] DOB: 1976-06-21  Referred By: Donell Beers, FNP Reason for referral: Complex Care Management (Initial outreach to schedule referral with PharmD Para March))   Keiandre Cygan is a 48 y.o. year old male who is a primary care patient of Donell Beers, FNP.  Kensley Lares was referred to the pharmacist for assistance related to: DMII  An unsuccessful telephone outreach was attempted today to contact the patient who was referred to the pharmacy team for assistance with medication management. Additional attempts will be made to contact the patient.  Gwenevere Ghazi  [facility]  Value-Based Care Institute, [facility] Guide  Direct Dial: [phone]  Fax [phone]

## 2023-12-20 NOTE — Progress Notes (Signed)
 Care Guide Pharmacy Note  12/20/2023 Name: Devon Pace MRN: [id number] DOB: 1975-10-02  Referred By: [name], [name], FNP Reason for referral: Complex Care Management (Initial outreach to schedule referral with PharmD Vallarie Gauze))   Devon Pace is a 48 y.o. year old male who is a primary care patient of [name], [name], FNP.  Devon Pace was referred to the pharmacist for assistance related to:  medication adherence   Successful contact was made with the patient to discuss pharmacy services including being ready for the pharmacist to call at least 5 minutes before the scheduled appointment time and to have medication bottles and any blood pressure readings ready for review. The patient agreed to meet with the pharmacist via telephone visit on (date/time). 4/29 at 11:00 AM   Devon Pace [facility]  [facility], [facility] Guide  Direct Dial: [phone]  Fax [phone]

## 2024-01-04 ENCOUNTER — Other Ambulatory Visit: Payer: Self-pay

## 2024-01-04 DIAGNOSIS — Z79899 Other long term (current) drug therapy: Secondary | ICD-10-CM

## 2024-01-04 DIAGNOSIS — E1169 Type 2 diabetes mellitus with other specified complication: Secondary | ICD-10-CM

## 2024-01-04 NOTE — Progress Notes (Addendum)
 01/04/2024 Name: Devon Pace MRN: [id number] DOB: [birth year]  Chief Complaint  Patient presents with   Medication Adherence    Devon Pace is a 48 y.o. year old male who presented for a telephone visit.   They were referred to the pharmacist by their PCP for assistance in managing medication adherence specifically.    Subjective:  Care Team: Primary Care Provider: [name], [name], FNP ; Next Scheduled Visit: 03/20/2024   Medication Access/Adherence  Current Pharmacy:  [id number] - [place], [place] - [address] AT [facility] [address] [facility] [address] [place] [address] Phone: [phone] Fax: [phone]   Patient reports affordability concerns with their medications: No  Patient reports access/transportation concerns to their pharmacy: N/A Patient reports adherence concerns with their medications:  No , per patient report     Medication Management:  Current adherence strategy: Keeps medication in rooms  Patient reports Fair and Poor adherence to medications - eventually admitted to forgetting second dose of metformin . He reports that he stopped taking atorvastatin  but recently restarted in April. He is enrolled in automatic refill with [employer].    Patient reports the following barriers to adherence:  - Patient reports that he stopped taking lisinopril  2.5 mg about 1 week ago due headaches after starting medication on 12/14/2023. He did not check his BP s/p HA but states hat he typically uses his watch (" no specific brand" that he is aware) to check his BP. He does NOT own BP cuff/machine. Reports that since self - discontinuing lisinopril  he does not endorse HA. Discussed how to handle s/e in the future and not d/c medication without talking to PCP first.   - He reports that he is tolerating metformin  well. He has a blood glucose monitor (BGM) and checks his blood sugar every 2-3 weeks using a [employer] BG meter;  he denies needing testing supplies. While on the phone, the patient spent some time looking for his blood glucose monitor (BGM) but was unable to find it and declined obtaining a new one. We discussed placing the monitor in the same location as his medication to encourage daily use, alternating between fasting blood glucose (FBG) and postprandial blood glucose (PPBG) readings.  Recent fill dates (per Dr. Anson Basta):       Insurance per patient: [facility] Medicaid    Objective:  Lab Results  Component Value Date   HGBA1C 10.5 (A) 11/12/2023    Lab Results  Component Value Date   CREATININE 0.97 11/12/2023   BUN 8 11/12/2023   NA 136 11/12/2023   K 4.8 11/12/2023   CL 101 11/12/2023   CO2 23 11/12/2023    Lab Results  Component Value Date   CHOL 88 (L) 11/12/2023   HDL 35 (L) 11/12/2023   LDLCALC 36 11/12/2023   TRIG 85 11/12/2023   CHOLHDL 2.5 11/12/2023    Medications Reviewed Today     Reviewed by Amedeo Bailiff, RPH (Pharmacist) on 01/04/24 at 1156  Med List Status: <None>   Medication Order Taking? Sig Documenting Provider Last Dose Status Informant  atorvastatin  (LIPITOR) 40 MG tablet [id number] Yes Take 1 tablet (40 mg total) by mouth daily. [name], [name], FNP Taking Active   empagliflozin  (JARDIANCE ) 10 MG TABS tablet [id number] Yes Take 1 tablet (10 mg total) by mouth daily before breakfast. [name], [name], FNP Taking Active   lisinopril  (ZESTRIL ) 2.5 MG tablet [id number] No Take 1 tablet (2.5 mg  total) by mouth daily.  Patient not taking: Reported on 01/04/2024   [name], [name], FNP Not Taking Active            Med Note Vallarie Gauze, [facility] R   Tue Jan 04, 2024 11:56 AM) S/e of HA   metFORMIN  (GLUCOPHAGE -XR) 500 MG 24 hr tablet [id number] Yes TAKE 2 TABLETS(1000 MG) BY MOUTH TWICE DAILY WITH A MEAL [name], [name], FNP Taking Active   sildenafil  (VIAGRA ) 50 MG tablet [id number] Yes Take 1 tablet (50 mg total) by mouth daily as needed for erectile  dysfunction. [name], [name], FNP Taking Active            Med Note Vallarie Gauze, [facility] R   Tue Jan 04, 2024 11:56 AM) Taking as needed              Assessment/Plan:   Medication Adherence:   Medication Management: - Currently strategy insufficient to maintain appropriate adherence to prescribed medication regimen - Suggested use of weekly pill box to organize medications - HTN: Advised patient to restart lisinopril  if tolerable and to contact PCP. Will collaborate with PCP to see if there are alternative therapies (ACE-I or ARB) as well as obtaining a blood pressure device. Discussed with patient to check his blood pressure 2-3 times per week and record readings. Will collaborate with PCP on obtaining an order for blood pressure device.  - Type 2 Diabetes: Metformin  prescription needs renewal and will collaborate with PCP. I discussed using pillbox and combing with phone alarm and Mr. Chukwu is agreeable to these changes. May consider starting insulin  (will allow use of CGM by insurance to improve compliance) or injectable medication for diabetes for future appointments considerations.  Alternatively, may consider oral options such as sulfonylurea, TZD, or GLP1 as add-on therapy since patient infrequently checks blood glucose or when patient is more compliant with current regimen. Discussed checking blood glucose once daily (alternating fasting and postprandial)  - Health Maintenance: Provider noted patient had an appointment with cardiologist but I do not see it scheduled. Will clarify with PCP cardiologist      Follow Up Plan: PCP appointment 03/20/2024, PharmD Telephone appointment prior to PCP   Alexandria Angel, PharmD Clinical Pharmacist Cell: [phone]

## 2024-01-10 LAB — HM DIABETES EYE EXAM

## 2024-01-14 MED ORDER — METFORMIN HCL ER 500 MG PO TB24: 1000.0000 mg | ORAL_TABLET | Freq: Two times a day (BID) | ORAL | 0 refills | Status: DC

## 2024-01-19 ENCOUNTER — Other Ambulatory Visit: Payer: Self-pay

## 2024-01-19 ENCOUNTER — Telehealth: Payer: Self-pay

## 2024-01-19 MED ORDER — OMRON 3 SERIES BP MONITOR DEVI: 1.0000 | 0 refills | Status: DC

## 2024-01-19 NOTE — Progress Notes (Signed)
Order placed. [facility]

## 2024-01-19 NOTE — Progress Notes (Signed)
   01/19/2024  Patient ID: Devon Pace, male   DOB: 12-12-1975, 48 y.o.   MRN: [id number]  Care Coordination Call  [facility] Delivered information for blood pressure order. Awaiting order to be placed .   Alexandria Angel, PharmD Clinical Pharmacist Cell: [phone]

## 2024-02-01 ENCOUNTER — Telehealth: Payer: Self-pay | Admitting: *Deleted

## 2024-02-01 NOTE — Progress Notes (Unsigned)
 Complex Care Management Care Guide Note  02/01/2024 Name: Kaedon Fanelli MRN: [id number] DOB: Jan 16, 1976  Kashaun Bebo is a 48 y.o. year old male who is a primary care patient of [name], [name], FNP and is actively engaged with the care management team. I reached out to Marden Shaggy by phone today to assist with re-scheduling  with the Pharmacist. For Inperson appt   Follow up plan: Unsuccessful telephone outreach attempt made. A HIPAA compliant phone message was left for the patient providing contact information and requesting a return call.  [name]  [facility]  Value-Based Care Institute, [facility] Guide  Direct Dial: [phone]  Fax [phone]

## 2024-02-02 NOTE — Progress Notes (Signed)
 Complex Care Management Care Guide Note  02/02/2024 Name: Devon Pace MRN: [id number] DOB: 11-May-1976  Devon Pace is a 48 y.o. year old male who is a primary care patient of [name], [name], FNP and is actively engaged with the care management team. I reached out to Marden Shaggy by phone today to assist with re-scheduling  with the Pharmacist.  Follow up plan: Face to Face appointment with complex care management team member scheduled for:  6/18 at 10:30 AM   [name]  [facility], [facility] Guide  Direct Dial: [phone]  Fax [phone]

## 2024-02-23 ENCOUNTER — Ambulatory Visit: Payer: Self-pay

## 2024-02-23 NOTE — Progress Notes (Deleted)
 02/23/2024 Name: Devon Pace MRN: [id number] DOB: 25-Mar-1976  No chief complaint on file.   Devon Pace is a 48 y.o. year old male who was referred for medication management by their primary care provider, [name], [name], FNP. They presented for a face to face visit today.   They were referred to the pharmacist by their PCP for assistance in managing medication adherence, diabetes.  PMH includes HTN, T2DM, HLD, glaucoma.   Subjective: Patient was last engaged by Alexandria Angel, PharmD on 01/04/24 to discuss medication adherence. He was advised to start using a weekly pill box. He had self-discontinued lisinopril  due to headaches and was advised to discuss alternatives with his PCP. At last visit with PCP on 12/13/23, A1C had increased from 7.5% to 10.5%.   GLP-1? A1C today  Care Team: Primary Care Provider: [name], [name], FNP ; Next Scheduled Visit: 03/20/24 {careteamprovider:27366}  Medication Access/Adherence  Current Pharmacy:  [id number] - [place], [place] - [address] AT [facility] [address] & [facility] [address] [place] [address] Phone: [phone] Fax: [phone]   Patient reports affordability concerns with their medications: {YES/NO:21197} Patient reports access/transportation concerns to their pharmacy: {YES/NO:21197} Patient reports adherence concerns with their medications:  {YES/NO:21197} ***  Fill history - atorvastatin  40 mg - last filled 10/22/23 for 90ds - Jardiance  10 mg - last filled 12/14/23 for 90ds - lisinopril  2.5 mg - last filled 12/14/23 for 90ds - metformin  XR 500 mg - last filled 12/14/23 for 30ds (#120 tabs)  Diabetes:  Current medications: metformin  XR 1000 mg BID, Jardiance  10 mg daily Medications tried in the past:   Current glucose readings: *** Using *** meter; testing *** times daily  Date of Download: *** % Time CGM is active: ***% Average Glucose: *** mg/dL Glucose Management Indicator: ***   Glucose Variability: *** (goal <36%) Time in Goal:  - Time in range 70-180: ***% - Time above range: ***% - Time below range: ***% Observed patterns:  Patient {Actions; denies-reports:120008} hypoglycemic s/sx including ***dizziness, shakiness, sweating. Patient {Actions; denies-reports:120008} hyperglycemic symptoms including ***polyuria, polydipsia, polyphagia, nocturia, neuropathy, blurred vision.  Current meal patterns:  - Breakfast: *** - Lunch *** - Supper *** - Snacks *** - Drinks ***  Current physical activity: ***  Current medication access support: Medicaid insurance  Medication Management:  Current adherence strategy: ***  Patient reports {Good Fair Poor:[phone]} adherence to medications  Patient reports the following barriers to adherence: ***  Recent fill dates:    Objective:  BP Readings from Last 3 Encounters:  12/13/23 120/75  11/12/23 135/76  07/26/23 132/82    Lab Results  Component Value Date   HGBA1C 10.5 (A) 11/12/2023   HGBA1C 7.5 (A) 07/26/2023   HGBA1C 11.7 (A) 04/23/2023   UACR 12/13/23: 16 mg/g  Lab Results  Component Value Date   CREATININE 0.97 11/12/2023   BUN 8 11/12/2023   NA 136 11/12/2023   K 4.8 11/12/2023   CL 101 11/12/2023   CO2 23 11/12/2023    Lab Results  Component Value Date   CHOL 88 (L) 11/12/2023   HDL 35 (L) 11/12/2023   LDLCALC 36 11/12/2023   TRIG 85 11/12/2023   CHOLHDL 2.5 11/12/2023    Medications Reviewed Today   Medications were not reviewed in this encounter       Assessment/Plan:   Diabetes: - Currently {CHL Controlled/[phone]} - Reviewed long term cardiovascular and renal outcomes of uncontrolled blood sugar - Reviewed goal A1c, goal  fasting, and goal 2 hour post prandial glucose - Reviewed dietary modifications including *** - Reviewed lifestyle modifications including:  - Recommend to ***  - Patient denies personal or family history of multiple endocrine  neoplasia type 2, medullary thyroid  cancer; personal history of pancreatitis or gallbladder disease. - Recommend to check glucose *** - Patient is appropriately treated with a high-intensity statin. Recommend to continue atorvastatin  40 mg daily.  - Patient is appropriately treated with an ACEi. Recommend to continue lisinopril  2.5 mg daily. - Meets financial criteria for *** patient assistance program through ***. Will collaborate with provider, CPhT, and patient to pursue assistance.      Medication Management: - Currently strategy {sufficient/insufficient:26424} to maintain appropriate adherence to prescribed medication regimen - Suggested use of weekly pill box to organize medications - Created list of medication, indication, and administration time. Provided to patient - Discussed collaboration with local pharmacies for adherence packaging. Reviewed local pharmacies with adherence packaging options. Patient elects to ***    Follow Up Plan: ***  ***

## 2024-02-24 ENCOUNTER — Other Ambulatory Visit (HOSPITAL_COMMUNITY): Payer: Self-pay

## 2024-02-24 ENCOUNTER — Other Ambulatory Visit: Payer: Self-pay

## 2024-02-24 ENCOUNTER — Other Ambulatory Visit (INDEPENDENT_AMBULATORY_CARE_PROVIDER_SITE_OTHER): Payer: Self-pay

## 2024-02-24 DIAGNOSIS — E785 Hyperlipidemia, unspecified: Secondary | ICD-10-CM

## 2024-02-24 DIAGNOSIS — E1169 Type 2 diabetes mellitus with other specified complication: Secondary | ICD-10-CM

## 2024-02-24 MED ORDER — ATORVASTATIN CALCIUM 40 MG PO TABS
40.0000 mg | ORAL_TABLET | Freq: Every day | ORAL | 3 refills | Status: DC
  Filled 2024-02-24: qty 90, 90d supply, fill #0

## 2024-02-24 MED ORDER — EMPAGLIFLOZIN 25 MG PO TABS
25.0000 mg | ORAL_TABLET | Freq: Every day | ORAL | 3 refills | Status: DC
  Filled 2024-02-24: qty 90, 90d supply, fill #0

## 2024-02-24 MED ORDER — METFORMIN HCL ER 500 MG PO TB24
1000.0000 mg | ORAL_TABLET | Freq: Two times a day (BID) | ORAL | 3 refills | Status: DC
  Filled 2024-02-24: qty 360, 90d supply, fill #0

## 2024-02-24 NOTE — Progress Notes (Signed)
 02/24/2024 Name: Oris Staffieri MRN: [id number] DOB: [birth year]  Chief Complaint  Patient presents with   Diabetes   Hypertension   Hyperlipidemia    Erving Sassano is a 48 y.o. year old male who was referred for medication management by their primary care provider, [name], [name], FNP. They presented for a telephone visit today.   They were referred to the pharmacist by their PCP for assistance in managing medication adherence, diabetes.  PMH includes HTN, T2DM, HLD, glaucoma.   Subjective: Patient was last engaged by Alexandria Angel, PharmD on 01/04/24 to discuss medication adherence. He was advised to start using a weekly pill box. He had self-discontinued lisinopril  due to headaches and was advised to discuss alternatives with his PCP. At last visit with PCP on 12/13/23, A1C had increased from 7.5% to 10.5%.   Today, patient reports doing well. He has run out of his atorvastatin  medication and has not been able to refill due to not being able to afford his medications.   Care Team: Primary Care Provider: [name], [name], FNP ; Next Scheduled Visit: 03/20/24  Medication Access/Adherence  Current Pharmacy:  [facility] [address] [place] [address] Phone: [phone] Fax: [phone]   Patient reports affordability concerns with their medications: Yes   - would be interested in using [facility] pharmacy. Explained [place] Medicaid law prohibiting withholding medication due to an inability to pay, and possibility of setting up a charge account with [facility].  Patient reports access/transportation concerns to their pharmacy: No  Patient reports adherence concerns with their medications:  Yes  - due to cost. He did stop taking lisinopril  due to it causing HA, blurry vision, did not feel right  Fill history - atorvastatin  40 mg - last filled 10/22/23 for 90ds - Jardiance  10 mg - last filled 12/14/23 for 90ds - lisinopril  2.5 mg -  last filled 12/14/23 for 90ds - metformin  XR 500 mg - last filled 12/14/23 for 30ds (#120 tabs)  Diabetes:  Current medications: metformin  XR 1000 mg BID, Jardiance  10 mg daily Medications tried in the past: insulin  glargine (2019), Januvia , Janumet   Current glucose readings: - reports that he checked BG two times over the past couple weeks. FBG 108, PPG 105 mg/dL. Using glucometer; testing 1-2 times per week  Patient denies hypoglycemic s/sx including dizziness, shakiness, sweating. Patient denies hyperglycemic symptoms including polyuria, polydipsia, polyphagia, nocturia (usually once per night), neuropathy, blurred vision. Denies fatigue.  Current meal patterns: 2 meals per day. Staying away from fried food, pasta, rice, bread.   Current medication access support: Medicaid insurance  Hypertension:  Current medications: none Medications previously tried: lisinopril  - dizziness, HA, did not feel right   Objective:  BP Readings from Last 3 Encounters:  12/13/23 120/75  11/12/23 135/76  07/26/23 132/82    Lab Results  Component Value Date   HGBA1C 10.5 (A) 11/12/2023   HGBA1C 7.5 (A) 07/26/2023   HGBA1C 11.7 (A) 04/23/2023   UACR 12/13/23: 16 mg/g  Lab Results  Component Value Date   CREATININE 0.97 11/12/2023   BUN 8 11/12/2023   NA 136 11/12/2023   K 4.8 11/12/2023   CL 101 11/12/2023   CO2 23 11/12/2023    Lab Results  Component Value Date   CHOL 88 (L) 11/12/2023   HDL 35 (L) 11/12/2023   LDLCALC 36 11/12/2023   TRIG 85 11/12/2023   CHOLHDL 2.5 11/12/2023    Medications Reviewed Today  Reviewed by Adra Alanis, RPH (Pharmacist) on 02/24/24 at 1500  Med List Status: <None>   Medication Order Taking? Sig Documenting Provider Last Dose Status Informant  atorvastatin  (LIPITOR) 40 MG tablet [id number]  Take 1 tablet (40 mg total) by mouth daily.  Patient not taking: Reported on 02/24/2024   [name], [name], FNP  Active   Blood Pressure Monitoring  (OMRON 3 SERIES BP MONITOR) DEVI [id number]  1 each by Does not apply route as directed. [name], [name], FNP  Active   empagliflozin  (JARDIANCE ) 10 MG TABS tablet [id number] Yes Take 1 tablet (10 mg total) by mouth daily before breakfast. [name], [name], FNP  Active    Patient not taking:   Discontinued 02/24/24 1448 (Side effect (s))            Med Note Vallarie Gauze, ASAJAH R   Tue Jan 04, 2024 11:56 AM) S/e of HA   metFORMIN  (GLUCOPHAGE -XR) 500 MG 24 hr tablet [id number] Yes Take 2 tablets (1,000 mg total) by mouth 2 (two) times daily with a meal. [name], [name], FNP  Active   sildenafil  (VIAGRA ) 50 MG tablet [id number]  Take 1 tablet (50 mg total) by mouth daily as needed for erectile dysfunction. [name], [name], FNP  Active            Med Note Vallarie Gauze, [facility] R   Tue Jan 04, 2024 11:56 AM) Taking as needed              Assessment/Plan:   Diabetes: - Currently uncontrolled with last A1C of 10.5% above goal < 7%, and worsened from 7.5% in Nov 2024. However, expect improvement with better adherence and patient reported BG in 100-110s, though limited data available today. Will assist patient in obtaining medications by transferring prescriptions to [facility] for mail delivery. Patient is tolerating Jardiance  and metformin  well. Will increase to full-dose Jardiance  to allow for maximum glycemic control.  - Reviewed long term cardiovascular and renal outcomes of uncontrolled blood sugar - Reviewed goal A1c, goal fasting, and goal 2 hour post prandial glucose - Reviewed dietary modifications including limiting intake of carbohydrate heavy foods, increasing intake of non-starchy vegetables, and increasing intake of protein. Recommended to stay hydrated throughout the day with water. - Recommend to continue metformin  XR 500 mg BID - Recommend to increase Jardiance  to 25 mg daily  - Patient denies personal or family history of multiple endocrine neoplasia type 2,  medullary thyroid  cancer; personal history of pancreatitis or gallbladder disease. - Recommend to check glucose once daily fasting - Patient is appropriately treated with a high-intensity statin. Recommend to continue atorvastatin  40 mg daily.  - Next A1C due now (obtain at next PCP appt)   Hypertension: - Currently controlled with last clinic BP below goal < 130/80 mmHg. Since then, patient resumed lisinopril  at 2.5 mg daily, but self-discontinued due to side effects. He does not have a compelling indication for an ACEi. If BP is uncontrolled at upcoming PCP appt, can consider initiating low-dose amlodipine. - Reviewed long term cardiovascular and renal outcomes of uncontrolled blood pressure   Follow Up Plan: PCP 03/20/24, PharmD telephone 04/17/24  Arthea Larsson, PharmD PGY1 Pharmacy Resident

## 2024-02-25 ENCOUNTER — Other Ambulatory Visit: Payer: Self-pay

## 2024-02-25 ENCOUNTER — Other Ambulatory Visit (HOSPITAL_COMMUNITY): Payer: Self-pay

## 2024-02-27 ENCOUNTER — Telehealth: Payer: Self-pay | Admitting: Nurse Practitioner

## 2024-02-27 NOTE — Telephone Encounter (Signed)
 Patient was identified as falling into the [place] Measure - Diabetes.   Patient was: Referred to pharmacy for chronic disease management.

## 2024-03-20 ENCOUNTER — Ambulatory Visit (INDEPENDENT_AMBULATORY_CARE_PROVIDER_SITE_OTHER): Payer: Self-pay | Admitting: Nurse Practitioner

## 2024-03-20 ENCOUNTER — Encounter: Payer: Self-pay | Admitting: Nurse Practitioner

## 2024-03-20 ENCOUNTER — Other Ambulatory Visit (HOSPITAL_COMMUNITY): Payer: Self-pay

## 2024-03-20 VITALS — BP 129/74 | HR 74 | Wt 156.0 lb

## 2024-03-20 DIAGNOSIS — N529 Male erectile dysfunction, unspecified: Secondary | ICD-10-CM

## 2024-03-20 DIAGNOSIS — E785 Hyperlipidemia, unspecified: Secondary | ICD-10-CM

## 2024-03-20 DIAGNOSIS — E119 Type 2 diabetes mellitus without complications: Secondary | ICD-10-CM | POA: Diagnosis not present

## 2024-03-20 DIAGNOSIS — E1169 Type 2 diabetes mellitus with other specified complication: Secondary | ICD-10-CM

## 2024-03-20 DIAGNOSIS — I10 Essential (primary) hypertension: Secondary | ICD-10-CM | POA: Diagnosis not present

## 2024-03-20 LAB — POCT GLYCOSYLATED HEMOGLOBIN (HGB A1C): Hemoglobin A1C: 6.3 % — AB (ref 4.0–5.6)

## 2024-03-20 MED ORDER — SILDENAFIL CITRATE 50 MG PO TABS
50.0000 mg | ORAL_TABLET | Freq: Every day | ORAL | 3 refills | Status: AC | PRN
  Filled 2024-03-20: qty 10, 10d supply, fill #0
  Filled 2024-05-30: qty 10, 10d supply, fill #1
  Filled 2024-09-26: qty 10, 10d supply, fill #2

## 2024-03-20 MED ORDER — EMPAGLIFLOZIN 25 MG PO TABS
25.0000 mg | ORAL_TABLET | Freq: Every day | ORAL | 3 refills | Status: AC
  Filled 2024-03-20 – 2024-05-26 (×2): qty 90, 90d supply, fill #0
  Filled 2024-09-26: qty 90, 90d supply, fill #1

## 2024-03-20 MED ORDER — METFORMIN HCL ER 500 MG PO TB24
1000.0000 mg | ORAL_TABLET | Freq: Two times a day (BID) | ORAL | 3 refills | Status: AC
  Filled 2024-03-20 – 2024-05-26 (×2): qty 360, 90d supply, fill #0
  Filled 2024-09-26: qty 360, 90d supply, fill #1

## 2024-03-20 MED ORDER — ATORVASTATIN CALCIUM 40 MG PO TABS
40.0000 mg | ORAL_TABLET | Freq: Every day | ORAL | 3 refills | Status: AC
  Filled 2024-03-20 – 2024-05-26 (×2): qty 90, 90d supply, fill #0
  Filled 2024-09-26: qty 90, 90d supply, fill #1

## 2024-03-20 NOTE — Assessment & Plan Note (Signed)
 Lab Results  Component Value Date   HGBA1C 6.3 (A) 03/20/2024    Lab Results  Component Value Date   CHOL 88 (L) 11/12/2023   HDL 35 (L) 11/12/2023   LDLCALC 36 11/12/2023   TRIG 85 11/12/2023   CHOLHDL 2.5 11/12/2023    Chronic medical conditions currently well-controlled on Jardiance  25 mg daily, metformin  1000 mg twice daily.  Takes atorvastatin  40 mg daily for hyperlipidemia Patient denies hypoglycemia, stated that he has been trying to follow a low-carb diet Patient counseled on low-carb diet continue current medications, was congratulated on his efforts at getting his diabetes under control Follow-up in 4 months Medications refilled

## 2024-03-20 NOTE — Progress Notes (Signed)
 Established Patient Office Visit  Subjective:  Patient ID: Devon Pace, male    DOB: 14-Mar-1976  Age: 48 y.o. MRN: [id number]  CC:  Chief Complaint  Patient presents with   Diabetes    HPI Devon Pace is a 48 y.o. male  has a past medical history of Blindness of left eye, Diabetes mellitus without complication (HCC), Glaucoma of both eyes, Hyperlipidemia, and Tobacco dependence.  Patient presents for follow-up for his chronic medical conditions Stated that he is doing well generally He denies any adverse reactions to current medications      Past Medical History:  Diagnosis Date   Blindness of left eye    Diabetes mellitus without complication (HCC)    Glaucoma of both eyes    Hyperlipidemia    Tobacco dependence     Past Surgical History:  Procedure Laterality Date   EYE SURGERY     SHOULDER ARTHROSCOPY WITH ROTATOR CUFF REPAIR     right shoulder 09/2017     Family History  Problem Relation Age of Onset   Diabetes Mother    Breast cancer Mother    Heart disease Father    Prostate cancer Paternal Uncle    Diabetes Maternal Grandmother    Prostate cancer Paternal Grandfather    Colon cancer Neg Hx     Social History   Socioeconomic History   Marital status: Single    Spouse name: Not on file   Number of children: Not on file   Years of education: Not on file   Highest education level: Not on file  Occupational History   Not on file  Tobacco Use   Smoking status: Former    Current packs/day: 0.00    Types: Cigarettes    Quit date: 08/2022    Years since quitting: 1.6   Smokeless tobacco: Never  Vaping Use   Vaping status: Some Days  Substance and Sexual Activity   Alcohol  use: Yes    Comment: occ   Drug use: Not Currently    Types: Marijuana   Sexual activity: Yes  Other Topics Concern   Not on file  Social History Narrative   Lives with his fiance   Social Drivers of Health   Financial Resource Strain: High Risk (11/04/2021)   Received  from [facility]   Overall Financial Resource Strain (CARDIA)    Difficulty of Paying Living Expenses: Very hard  Food Insecurity: Food Insecurity Present (11/04/2021)   Received from [facility]   Hunger Vital Sign    Within the past 12 months, you worried that your food would run out before you got the money to buy more.: Often true    Within the past 12 months, the food you bought just didn't last and you didn't have money to get more.: Often true  Transportation Needs: Unmet Transportation Needs (11/04/2021)   Received from [facility]   PRAPARE - Transportation    In the past 12 months, has lack of transportation kept you from medical appointments or from getting medications?: No    Lack of Transportation (Non-Medical): Yes  Physical Activity: Inactive (11/04/2021)   Received from [facility]   Exercise Vital Sign    On average, how many days per week do you engage in moderate to strenuous exercise (like a brisk walk)?: 0 days    On average, how many minutes do you engage in exercise at this level?: 0 min  Stress: Stress Concern  Present (11/04/2021)   Received from [facility] - Occupational Stress Questionnaire    Feeling of Stress : Very much  Social Connections: Socially Integrated (11/04/2021)   Received from [facility]   Social Connection and Isolation Panel    In a typical week, how many times do you talk on the phone with family, friends, or neighbors?: Three times a week    Frequency of Social Gatherings with Friends and Family: Not on file    How often do you attend church or religious services?: More than 4 times per year    Do you belong to any clubs or organizations such as church groups, unions, fraternal or athletic groups, or school groups?: Yes    How often do you attend meetings of the clubs or organizations you belong  to?: More than 4 times per year    Are you married, widowed, divorced, separated, never married, or living with a partner?: Living with partner  Intimate Partner Violence: Not on file    Outpatient Medications Prior to Visit  Medication Sig Dispense Refill   Blood Pressure Monitoring (OMRON 3 SERIES BP MONITOR) DEVI 1 each by Does not apply route as directed. 1 each 0   atorvastatin  (LIPITOR) 40 MG tablet Take 1 tablet (40 mg total) by mouth daily. 90 tablet 3   empagliflozin  (JARDIANCE ) 25 MG TABS tablet Take 1 tablet (25 mg total) by mouth daily before breakfast. 90 tablet 3   metFORMIN  (GLUCOPHAGE -XR) 500 MG 24 hr tablet Take 2 tablets (1,000 mg total) by mouth 2 (two) times daily with a meal. 360 tablet 3   sildenafil  (VIAGRA ) 50 MG tablet Take 1 tablet (50 mg total) by mouth daily as needed for erectile dysfunction. 10 tablet 3   No facility-administered medications prior to visit.    Allergies  Allergen Reactions   Lisinopril  Other (See Comments)    Headache throughout day     ROS Review of Systems  Constitutional:  Negative for appetite change, chills, fatigue and fever.  HENT:  Negative for congestion, postnasal drip, rhinorrhea and sneezing.   Eyes:  Positive for visual disturbance.  Respiratory:  Negative for cough, shortness of breath and wheezing.   Cardiovascular:  Negative for chest pain, palpitations and leg swelling.  Gastrointestinal:  Negative for abdominal pain, constipation, nausea and vomiting.  Genitourinary:  Negative for difficulty urinating, dysuria, flank pain and frequency.  Musculoskeletal:  Negative for arthralgias, back pain, joint swelling and myalgias.  Skin:  Negative for color change, pallor, rash and wound.  Neurological:  Negative for dizziness, facial asymmetry, weakness, numbness and headaches.  Psychiatric/Behavioral:  Negative for behavioral problems, confusion, self-injury and suicidal ideas.       Objective:    Physical Exam Vitals  and nursing note reviewed.  Constitutional:      General: He is not in acute distress.    Appearance: Normal appearance. He is not ill-appearing, toxic-appearing or diaphoretic.  HENT:     Mouth/Throat:     Pharynx: Oropharynx is clear.  Eyes:     Comments: Legally blind  Cardiovascular:     Rate and Rhythm: Normal rate and regular rhythm.     Pulses: Normal pulses.     Heart sounds: Normal heart sounds. No murmur heard.    No friction rub. No gallop.  Pulmonary:     Effort: Pulmonary effort is normal. No respiratory distress.     Breath sounds: Normal breath  sounds. No stridor. No wheezing, rhonchi or rales.  Chest:     Chest wall: No tenderness.  Abdominal:     General: There is no distension.     Palpations: Abdomen is soft.     Tenderness: There is no abdominal tenderness. There is no right CVA tenderness, left CVA tenderness or guarding.  Musculoskeletal:        General: No swelling, tenderness, deformity or signs of injury.     Right lower leg: No edema.     Left lower leg: No edema.  Skin:    General: Skin is warm and dry.     Capillary Refill: Capillary refill takes less than 2 seconds.     Coloration: Skin is not jaundiced or pale.     Findings: No bruising, erythema or lesion.  Neurological:     Mental Status: He is alert and oriented to person, place, and time.     Motor: No weakness.     Gait: Gait normal.  Psychiatric:        Mood and Affect: Mood normal.        Behavior: Behavior normal.        Thought Content: Thought content normal.        Judgment: Judgment normal.     BP 129/74   Pulse 74   Wt 156 lb (70.8 kg)   SpO2 100%   BMI 24.43 kg/m  Wt Readings from Last 3 Encounters:  03/20/24 156 lb (70.8 kg)  12/13/23 161 lb (73 kg)  11/12/23 158 lb (71.7 kg)    Lab Results  Component Value Date   TSH 0.551 08/26/2017   Lab Results  Component Value Date   WBC 4.9 11/12/2023   HGB 12.2 (L) 11/12/2023   HCT 42.0 11/12/2023   MCV 75 (L)  11/12/2023   PLT 264 11/12/2023   Lab Results  Component Value Date   NA 136 11/12/2023   K 4.8 11/12/2023   CO2 23 11/12/2023   GLUCOSE 269 (H) 11/12/2023   BUN 8 11/12/2023   CREATININE 0.97 11/12/2023   BILITOT 0.7 11/12/2023   ALKPHOS 119 11/12/2023   AST 17 11/12/2023   ALT 31 11/12/2023   PROT 7.3 11/12/2023   ALBUMIN 4.5 11/12/2023   CALCIUM  9.7 11/12/2023   ANIONGAP 13 12/16/2017   EGFR 98 11/12/2023   Lab Results  Component Value Date   CHOL 88 (L) 11/12/2023   Lab Results  Component Value Date   HDL 35 (L) 11/12/2023   Lab Results  Component Value Date   LDLCALC 36 11/12/2023   Lab Results  Component Value Date   TRIG 85 11/12/2023   Lab Results  Component Value Date   CHOLHDL 2.5 11/12/2023   Lab Results  Component Value Date   HGBA1C 6.3 (A) 03/20/2024      Assessment & Plan:   Problem List Items Addressed This Visit       Cardiovascular and Mediastinum   Hypertension   DASH diet and commitment to daily physical activity for a minimum of 30 minutes discussed and encouraged, as a part of hypertension management. Currently not on medication, stated that lisinopril  makes him feel dizzy, blood pressure is well-controlled today     03/20/2024    2:13 PM 03/20/2024    2:04 PM 12/13/2023    1:59 PM 11/12/2023    2:08 PM 11/12/2023    1:39 PM 07/26/2023    2:02 PM 07/26/2023    1:58 PM  BP/Weight  Systolic BP 129 139 120 135 135 132 138  Diastolic BP 74 82 75 76 74 82 81  Wt. (Lbs)  156 161  158    BMI  24.43 kg/m2 25.22 kg/m2  24.75 kg/m2             Relevant Medications   atorvastatin  (LIPITOR) 40 MG tablet   sildenafil  (VIAGRA ) 50 MG tablet     Endocrine   Hyperlipidemia associated with type 2 diabetes mellitus (HCC)   Lab Results  Component Value Date   HGBA1C 6.3 (A) 03/20/2024    Lab Results  Component Value Date   CHOL 88 (L) 11/12/2023   HDL 35 (L) 11/12/2023   LDLCALC 36 11/12/2023   TRIG 85 11/12/2023   CHOLHDL 2.5  11/12/2023    Chronic medical conditions currently well-controlled on Jardiance  25 mg daily, metformin  1000 mg twice daily.  Takes atorvastatin  40 mg daily for hyperlipidemia Patient denies hypoglycemia, stated that he has been trying to follow a low-carb diet Patient counseled on low-carb diet continue current medications, was congratulated on his efforts at getting his diabetes under control Follow-up in 4 months Medications refilled       Relevant Medications   atorvastatin  (LIPITOR) 40 MG tablet   empagliflozin  (JARDIANCE ) 25 MG TABS tablet   metFORMIN  (GLUCOPHAGE -XR) 500 MG 24 hr tablet   sildenafil  (VIAGRA ) 50 MG tablet     Other   Erectile dysfunction   Relevant Medications   sildenafil  (VIAGRA ) 50 MG tablet   Other Visit Diagnoses       Controlled type 2 diabetes mellitus without complication, without long-term current use of insulin  (HCC)    -  Primary   Relevant Medications   atorvastatin  (LIPITOR) 40 MG tablet   empagliflozin  (JARDIANCE ) 25 MG TABS tablet   metFORMIN  (GLUCOPHAGE -XR) 500 MG 24 hr tablet   Other Relevant Orders   POCT glycosylated hemoglobin (Hb A1C) (Completed)   Basic Metabolic Panel       Meds ordered this encounter  Medications   atorvastatin  (LIPITOR) 40 MG tablet    Sig: Take 1 tablet (40 mg total) by mouth daily.    Dispense:  90 tablet    Refill:  3    Mail order - put on [facility] account (medicaid patient)   empagliflozin  (JARDIANCE ) 25 MG TABS tablet    Sig: Take 1 tablet (25 mg total) by mouth daily before breakfast.    Dispense:  90 tablet    Refill:  3    Mail order - put on [facility] account (medicaid patient)   metFORMIN  (GLUCOPHAGE -XR) 500 MG 24 hr tablet    Sig: Take 2 tablets (1,000 mg total) by mouth 2 (two) times daily with a meal.    Dispense:  360 tablet    Refill:  3    Mail order - put on [facility] account (medicaid patient)   sildenafil  (VIAGRA ) 50 MG tablet    Sig: Take 1 tablet (50 mg total) by mouth daily as needed for erectile  dysfunction.    Dispense:  10 tablet    Refill:  3    Follow-up: Return in about 4 months (around 07/21/2024) for DM, HYPERLIPIDEMIA.    Shenita Trego R Tia Gelb, FNP

## 2024-03-20 NOTE — Assessment & Plan Note (Signed)
 DASH diet and commitment to daily physical activity for a minimum of 30 minutes discussed and encouraged, as a part of hypertension management. Currently not on medication, stated that lisinopril  makes him feel dizzy, blood pressure is well-controlled today     03/20/2024    2:13 PM 03/20/2024    2:04 PM 12/13/2023    1:59 PM 11/12/2023    2:08 PM 11/12/2023    1:39 PM 07/26/2023    2:02 PM 07/26/2023    1:58 PM  BP/Weight  Systolic BP 129 139 120 135 135 132 138  Diastolic BP 74 82 75 76 74 82 81  Wt. (Lbs)  156 161  158    BMI  24.43 kg/m2 25.22 kg/m2  24.75 kg/m2

## 2024-03-20 NOTE — Patient Instructions (Signed)
 Goal for fasting blood sugar ranges from 80 to 120 and 2 hours after any meal or at bedtime should be between 130 to 170.   It is important that you exercise regularly at least 30 minutes 5 times a week as tolerated  Think about what you will eat, plan ahead. Choose " clean, green, fresh or frozen" over canned, processed or packaged foods which are more sugary, salty and fatty. 70 to 75% of food eaten should be vegetables and fruit. Three meals at set times with snacks allowed between meals, but they must be fruit or vegetables. Aim to eat over a 12 hour period , example 7 am to 7 pm, and STOP after  your last meal of the day. Drink water,generally about 64 ounces per day, no other drink is as healthy. Fruit juice is best enjoyed in a healthy way, by EATING the fruit.  Thanks for choosing [facility] we consider it a privelige to serve you.

## 2024-03-21 ENCOUNTER — Ambulatory Visit: Payer: Self-pay | Admitting: Nurse Practitioner

## 2024-03-21 LAB — BASIC METABOLIC PANEL WITH GFR
BUN/Creatinine Ratio: 11 (ref 9–20)
BUN: 12 mg/dL (ref 6–24)
CO2: 19 mmol/L — ABNORMAL LOW (ref 20–29)
Calcium: 9.5 mg/dL (ref 8.7–10.2)
Chloride: 106 mmol/L (ref 96–106)
Creatinine, Ser: 1.09 mg/dL (ref 0.76–1.27)
Glucose: 119 mg/dL — ABNORMAL HIGH (ref 70–99)
Potassium: 4.6 mmol/L (ref 3.5–5.2)
Sodium: 139 mmol/L (ref 134–144)
eGFR: 85 mL/min/1.73 (ref >59)

## 2024-04-17 ENCOUNTER — Other Ambulatory Visit: Payer: Self-pay

## 2024-04-17 DIAGNOSIS — E1169 Type 2 diabetes mellitus with other specified complication: Secondary | ICD-10-CM

## 2024-04-17 NOTE — Progress Notes (Signed)
 04/17/2024 Name: Devon Pace MRN: [id number] DOB: [birth year]  Chief Complaint  Patient presents with   Diabetes    Devon Pace is a 48 y.o. year old male who was referred for medication management by their primary care provider, [name], [name], FNP. They presented for a telephone visit today.   They were referred to the pharmacist by their PCP for assistance in managing medication adherence, diabetes.  PMH includes HTN, T2DM, HLD, glaucoma.   Subjective: Patient was last engaged by Dorcas Solian, PharmD on 01/04/24 to discuss medication adherence. He was advised to start using a weekly pill box. He had self-discontinued lisinopril  due to headaches and was advised to discuss alternatives with his PCP. At last visit with PCP on 12/13/23, A1C had increased from 7.5% to 10.5%. Patient was engaged by pharmacy via telephone on 02/24/24. He reported that he had not been able to refill some of his medications due to cost. He was switched to [facility] pharmacy for mail order. He was instructed to increase Jardiance  to 25 mg daily for maximum glycemic control. At his PCP appt on 03/20/24, his A1C had improved from 10.5% to 6.3%. His BP was controlled at 129/74 mmHg without medication.   Today, patient reports doing well. He has current supplies of his medications.  Care Team: Primary Care Provider: [name], [name], FNP ; Next Scheduled Visit: 07/21/24  Medication Access/Adherence  Current Pharmacy:  [facility] - [facility] [address] [place] [address] Phone: [phone] Fax: [phone]   Patient reports affordability concerns with their medications: Yes   - Using [facility] mail order pharmacy with [facility] account, as he cannot currently afford his medications. Aware of  [place] Medicaid law prohibiting withholding medication due to an inability to pay.  Patient reports access/transportation concerns to their pharmacy: Yes  - Using [facility] for mail order  Patient reports  adherence concerns with their medications:  Yes  - hx of stopping meds due to cost. Maintenance meds last filled via mail order from [facility] on 02/25/24  Diabetes:  Current medications: metformin  XR 1000 mg BID, Jardiance  25 mg daily Medications tried in the past: insulin  glargine (2019), Januvia , Janumet   He does report more thirst since increasing Jardiance  to 25 mg daily  Current glucose readings: - reports that he hast not checked BG in a couple weeks. Most recent FBG he recalls was 107 mg/dL. Using glucometer; previously testing 1-2 times per week  Patient denies hypoglycemic s/sx including dizziness, shakiness, sweating. Patient denies hyperglycemic symptoms including polyuria, polydipsia, polyphagia, nocturia (usually once per night), neuropathy, blurred vision. Denies fatigue.  Current meal patterns: 2 meals per day. Staying away from fried food, pasta, rice, bread. Reports staying hydrated with water (6 bottles/day)  Current medication access support: Medicaid insurance  Hypertension:  Current medications: none Medications previously tried: lisinopril  - dizziness, HA, did not feel right   Objective:  BP Readings from Last 3 Encounters:  03/20/24 129/74  12/13/23 120/75  11/12/23 135/76    Lab Results  Component Value Date   HGBA1C 6.3 (A) 03/20/2024   HGBA1C 10.5 (A) 11/12/2023   HGBA1C 7.5 (A) 07/26/2023   UACR 12/13/23: 16 mg/g  Lab Results  Component Value Date   CREATININE 1.09 03/20/2024   BUN 12 03/20/2024   NA 139 03/20/2024   K 4.6 03/20/2024   CL 106 03/20/2024   CO2 19 (L) 03/20/2024    Lab Results  Component Value Date   CHOL 88 (L)  11/12/2023   HDL 35 (L) 11/12/2023   LDLCALC 36 11/12/2023   TRIG 85 11/12/2023   CHOLHDL 2.5 11/12/2023    Medications Reviewed Today     Reviewed by [name] SQUIBB, RPH (Pharmacist) on 04/17/24 at 1416  Med List Status: <None>   Medication Order Taking? Sig Documenting Provider Last Dose Status  Informant  atorvastatin  (LIPITOR) 40 MG tablet [id number] Yes Take 1 tablet (40 mg total) by mouth daily. [name], [name], FNP  Active   Blood Pressure Monitoring (OMRON 3 SERIES BP MONITOR) DEVI [id number]  1 each by Does not apply route as directed. [name], [name], FNP  Active   empagliflozin  (JARDIANCE ) 25 MG TABS tablet [id number] Yes Take 1 tablet (25 mg total) by mouth daily before breakfast. [name], [name], FNP  Active   metFORMIN  (GLUCOPHAGE -XR) 500 MG 24 hr tablet [id number] Yes Take 2 tablets (1,000 mg total) by mouth 2 (two) times daily with a meal. [name], [name], FNP  Active   sildenafil  (VIAGRA ) 50 MG tablet [id number]  Take 1 tablet (50 mg total) by mouth daily as needed for erectile dysfunction. [name], [name], FNP  Active               Assessment/Plan:   Diabetes: - Currently controlled with last A1C of 6.3% below goal < 7%, and improved from 10.5% with improved medication adherence. Patient is tolerating current regimen well. Will continue to follow for adherence concerns.  - Reviewed long term cardiovascular and renal outcomes of uncontrolled blood sugar - Reviewed goal A1c, goal fasting, and goal 2 hour post prandial glucose - Reviewed dietary modifications including limiting intake of carbohydrate heavy foods, increasing intake of non-starchy vegetables, and increasing intake of protein. Recommended to stay hydrated throughout the day with water. - Recommend to continue metformin  XR 500 mg BID - Recommend to continue Jardiance  to 25 mg daily  - Recommend to check glucose once daily fasting - Patient is appropriately treated with a high-intensity statin. Recommend to continue atorvastatin  40 mg daily.  - Next A1C due October 2025   Hypertension: - Currently controlled with last clinic BP below goal < 130/80 mmHg. BP has been at goal without medication. He does not have a compelling indication for an ACEi. ng low-dose amlodipine. - Reviewed long  term cardiovascular and renal outcomes of uncontrolled blood pressure   Follow Up Plan: PCP 03/20/24, PharmD telephone 05/26/24   Lorain Brinda, PharmD [facility] [phone]

## 2024-05-11 ENCOUNTER — Other Ambulatory Visit (HOSPITAL_COMMUNITY): Payer: Self-pay

## 2024-05-26 ENCOUNTER — Other Ambulatory Visit: Payer: Self-pay

## 2024-05-26 ENCOUNTER — Other Ambulatory Visit (HOSPITAL_COMMUNITY): Payer: Self-pay

## 2024-05-26 DIAGNOSIS — E785 Hyperlipidemia, unspecified: Secondary | ICD-10-CM

## 2024-05-26 NOTE — Progress Notes (Signed)
 05/26/2024 Name: Devon Pace MRN: [id number] DOB: 12-27-1975  Chief Complaint  Patient presents with   Diabetes   Hyperlipidemia    Devon Pace is a 48 y.o. year old male who was referred for medication management by their primary care provider, [name], [name], FNP. They presented for a telephone visit today.   They were referred to the pharmacist by their PCP for assistance in managing medication adherence, diabetes.  PMH includes HTN, T2DM, HLD, glaucoma.   Subjective: Patient was engaged by Dorcas Solian, PharmD on 01/04/24 to discuss medication adherence. He was advised to start using a weekly pill box. He had self-discontinued lisinopril  due to headaches and was advised to discuss alternatives with his PCP. At last visit with PCP on 12/13/23, A1C had increased from 7.5% to 10.5%. Patient was engaged by pharmacy via telephone on 02/24/24. He reported that he had not been able to refill some of his medications due to cost. He was switched to [facility] pharmacy for mail order. He was instructed to increase Jardiance  to 25 mg daily for maximum glycemic control. At his PCP appt on 03/20/24, his A1C had improved from 10.5% to 6.3%. His BP was controlled at 129/74 mmHg without medication. At pharmacy telephone appt on 04/17/24, patient reported doing well with current supplies of his medications.   Today, patient reports doing well. He has current supplies of his medications. He said he has about 9 days left of his maintenance medications. He received the bill from the pharmacy in the mail for the previous round and was able to pay it. He would like to proceed with this method of receiving his medications again.  Care Team: Primary Care Provider: [name], [name], FNP ; Next Scheduled Visit: 07/21/24  Medication Access/Adherence  Current Pharmacy:  [facility] - [facility] [address] [place] [address] Phone: [phone] Fax: [phone]   Patient reports  affordability concerns with their medications: Yes   - Using [facility] mail order pharmacy with [facility] account, as he cannot currently afford his medications. Aware of  [employer] Medicaid law prohibiting withholding medication due to an inability to pay.  Patient reports access/transportation concerns to their pharmacy: Yes  - Using [facility] for mail order  Patient reports adherence concerns with their medications:  Yes  - hx of stopping meds due to cost. Maintenance meds last filled via mail order from [facility] on 02/25/24  Diabetes:  Current medications: metformin  XR 1000 mg BID, Jardiance  25 mg daily Medications tried in the past: insulin  glargine (2019), Januvia , Janumet   He does report more thirst since increasing Jardiance  to 25 mg daily - but this is manageable and he is staying hydrated with water  Current glucose readings: - reports that he hast not checked BG in a couple weeks. Most recent FBG he recalls was 120 mg/dL about a week ago.  Using glucometer; previously testing 1-2 times per week  Patient denies hypoglycemic s/sx including dizziness, shakiness, sweating. Patient denies hyperglycemic symptoms including polyuria, polydipsia, polyphagia, nocturia (usually once per night), neuropathy, blurred vision. Denies fatigue.  Current meal patterns: 2 meals per day. Staying away from fried food, pasta, rice, bread. Reports staying hydrated with water (6 bottles/day)  Current medication access support: Medicaid insurance  Hypertension:  Current medications: none Medications previously tried: lisinopril  - dizziness, HA, did not feel right  Hyperlipidemia/ASCVD Risk Reduction  Current lipid lowering medications: atorvastatin  40 mg daily   Objective:  BP Readings from Last 3 Encounters:  03/20/24 129/74  12/13/23 120/75  11/12/23 135/76    Lab Results  Component Value Date   HGBA1C 6.3 (A) 03/20/2024   HGBA1C 10.5 (A) 11/12/2023   HGBA1C 7.5 (A) 07/26/2023   UACR 12/13/23: 16  mg/g  Lab Results  Component Value Date   CREATININE 1.09 03/20/2024   BUN 12 03/20/2024   NA 139 03/20/2024   K 4.6 03/20/2024   CL 106 03/20/2024   CO2 19 (L) 03/20/2024    Lab Results  Component Value Date   CHOL 88 (L) 11/12/2023   HDL 35 (L) 11/12/2023   LDLCALC 36 11/12/2023   TRIG 85 11/12/2023   CHOLHDL 2.5 11/12/2023    Medications Reviewed Today     Reviewed by [name] SQUIBB, RPH (Pharmacist) on 05/26/24 at 1209  Med List Status: <None>   Medication Order Taking? Sig Documenting Provider Last Dose Status Informant  atorvastatin  (LIPITOR) 40 MG tablet [id number] Yes Take 1 tablet (40 mg total) by mouth daily. [name], [name], FNP  Active     Discontinued 05/26/24 1208 (No longer needed (for PRN medications))   empagliflozin  (JARDIANCE ) 25 MG TABS tablet [id number] Yes Take 1 tablet (25 mg total) by mouth daily before breakfast. [name], [name], FNP  Active   metFORMIN  (GLUCOPHAGE -XR) 500 MG 24 hr tablet [id number] Yes Take 2 tablets (1,000 mg total) by mouth 2 (two) times daily with a meal. [name], [name], FNP  Active   sildenafil  (VIAGRA ) 50 MG tablet [id number] Yes Take 1 tablet (50 mg total) by mouth daily as needed for erectile dysfunction. [name], [name], FNP  Active               Assessment/Plan:   Diabetes: - Currently controlled with last A1C of 6.3% below goal < 7%, and improved from 10.5% with improved medication adherence. Patient is tolerating current regimen well. Will continue to follow for adherence concerns.  - Reviewed long term cardiovascular and renal outcomes of uncontrolled blood sugar - Reviewed goal A1c, goal fasting, and goal 2 hour post prandial glucose - Reviewed dietary modifications including limiting intake of carbohydrate heavy foods, increasing intake of non-starchy vegetables, and increasing intake of protein. Recommended to stay hydrated throughout the day with water. - Recommend to continue metformin  XR 500 mg  BID - Recommend to continue Jardiance  to 25 mg daily  - Recommend to check glucose once daily fasting - Patient is appropriately treated with a high-intensity statin. Recommend to continue atorvastatin  40 mg daily.  - Next A1C due October 2025   Hypertension: - Currently controlled with last clinic BP below goal < 130/80 mmHg. BP has been at goal without medication. He does not have a compelling indication for an ACEi. ng low-dose amlodipine. - Reviewed long term cardiovascular and renal outcomes of uncontrolled blood pressure   Hyperlipidemia/ASCVD Risk Reduction: - Currently controlled with most recent LDL-C of 36 mg/dL below goal < 70 mg/dL given U7IF + comorbidities. High intensity statin indicated. - Recommend to continue atorvastatin  40 mg daily    Requested refill of maintenance medications via [facility] mail order and [facility] account today. Provided patient name of dentist that takes Medicaid in [place] as requested.   Follow Up Plan: PCP 07/21/24, PharmD telephone 08/18/24   Lorain Brinda, PharmD [facility] [phone]

## 2024-05-31 ENCOUNTER — Other Ambulatory Visit (HOSPITAL_COMMUNITY): Payer: Self-pay

## 2024-05-31 ENCOUNTER — Other Ambulatory Visit: Payer: Self-pay

## 2024-07-12 LAB — OPHTHALMOLOGY REPORT-SCANNED

## 2024-07-21 ENCOUNTER — Ambulatory Visit: Payer: Self-pay | Admitting: Nurse Practitioner

## 2024-08-01 ENCOUNTER — Other Ambulatory Visit (HOSPITAL_COMMUNITY): Payer: Self-pay

## 2024-08-01 ENCOUNTER — Other Ambulatory Visit: Payer: Self-pay

## 2024-08-01 MED ORDER — LATANOPROST 0.005 % OP SOLN
1.0000 [drp] | Freq: Every day | OPHTHALMIC | 5 refills | Status: AC
Start: 2024-08-01 — End: ?
  Filled 2024-08-01: qty 7.5, 90d supply, fill #0

## 2024-08-02 ENCOUNTER — Encounter: Payer: Self-pay | Admitting: Nurse Practitioner

## 2024-08-02 ENCOUNTER — Ambulatory Visit: Payer: Self-pay | Admitting: Nurse Practitioner

## 2024-08-02 VITALS — BP 140/81 | HR 66 | Wt 163.0 lb

## 2024-08-02 DIAGNOSIS — H402233 Chronic angle-closure glaucoma, bilateral, severe stage: Secondary | ICD-10-CM

## 2024-08-02 DIAGNOSIS — I10 Essential (primary) hypertension: Secondary | ICD-10-CM

## 2024-08-02 DIAGNOSIS — Z23 Encounter for immunization: Secondary | ICD-10-CM | POA: Diagnosis not present

## 2024-08-02 DIAGNOSIS — N529 Male erectile dysfunction, unspecified: Secondary | ICD-10-CM | POA: Diagnosis not present

## 2024-08-02 DIAGNOSIS — E785 Hyperlipidemia, unspecified: Secondary | ICD-10-CM | POA: Diagnosis not present

## 2024-08-02 DIAGNOSIS — E119 Type 2 diabetes mellitus without complications: Secondary | ICD-10-CM | POA: Insufficient documentation

## 2024-08-02 DIAGNOSIS — E1169 Type 2 diabetes mellitus with other specified complication: Secondary | ICD-10-CM

## 2024-08-02 LAB — POCT GLYCOSYLATED HEMOGLOBIN (HGB A1C): Hemoglobin A1C: 6.1 % — AB (ref 4.0–5.6)

## 2024-08-02 NOTE — Assessment & Plan Note (Deleted)
  A1c well-controlled at 6.1%. Postprandial blood sugars 177 mg/dL and 847 mg/dL. Goal A1c <6.5%. - Continue metformin  1000 mg twice daily and empagliflozin  25 mg daily. - Encouraged adherence to low carb diet and exercise regimen. Diabetic foot exam completed

## 2024-08-02 NOTE — Patient Instructions (Addendum)
 Goal for fasting blood sugar ranges from 80 to 120 and 2 hours after any meal or at bedtime should be between 130 to 170.   Around 3 times per week, check your blood pressure 2 times per day. once in the morning and once in the evening. The readings should be at least one minute apart. Write down these values and bring them to your next nurse visit/appointment.  When you check your BP, make sure you have been doing something calm/relaxing 5 minutes prior to checking. Both feet should be flat on the floor and you should be sitting. Use your left arm and make sure it is in a relaxed position (on a table), and that the cuff is at the approximate level/height of your heart. Report blood pressure reading consistently greater than 130/80    It is important that you exercise regularly at least 30 minutes 5 times a week as tolerated  Think about what you will eat, plan ahead. Choose  clean, green, fresh or frozen over canned, processed or packaged foods which are more sugary, salty and fatty. 70 to 75% of food eaten should be vegetables and fruit. Three meals at set times with snacks allowed between meals, but they must be fruit or vegetables. Aim to eat over a 12 hour period , example 7 am to 7 pm, and STOP after  your last meal of the day. Drink water,generally about 64 ounces per day, no other drink is as healthy. Fruit juice is best enjoyed in a healthy way, by EATING the fruit.  Thanks for choosing [facility] we consider it a privelige to serve you.

## 2024-08-02 NOTE — Assessment & Plan Note (Signed)
 Chronic angle-closure glaucoma, bilateral, severe stage Right eye has two tubes, left eye one tube. Eye pressure fluctuates. Using latanoprost  in right eye at bedtime. - Continue latanoprost  in the right eye at bedtime. - Encouraged follow-up with ophthalmology and requested office notes for chart update.

## 2024-08-02 NOTE — Progress Notes (Addendum)
 Established Patient Office Visit  Subjective:  Patient ID: Devon Pace, male    DOB: 02-Jun-1976  Age: 48 y.o. MRN: [id number]  CC:  Chief Complaint  Patient presents with   Diabetes   Hyperlipidemia    HPI    Discussed the use of AI scribe software for clinical note transcription with the patient, who gave verbal consent to proceed.  History of Present Illness Devon Pace is a 48 year old male   has a past medical history of Blindness of left eye, Diabetes mellitus without complication (HCC), Glaucoma of both eyes, Hyperlipidemia, and Tobacco dependence. who presents for follow-up of his chronic conditions.  He has hypertension with fluctuating blood pressure readings at home, ranging from 130/85 mmHg to 140 mmHg systolic. He is not currently taking lisinopril  due to previous side effects of feeling jittery and weird. He monitors his blood pressure at home and notes that it is sometimes above 130/80 mmHg, but not consistently.  He has diabetes and is currently taking metformin  1000 mg twice daily and Jardiance  25 mg daily. His last A1c was 6.1%, which he maintains through medication, exercise, and diet. He checks his blood sugar at home, with recent readings of 177 mg/dL postprandial and 847 mg/dL fasting.  For hyperlipidemia, he is on atorvastatin  40 mg daily. His cholesterol levels were last checked and were reported as good.  He has a history of glaucoma, with tubes placed in both eyes due to fluctuating intraocular pressure. He uses latanoprost  eye drops in the right eye at bedtime. He reports vision loss in the left eye since 2023, which was not surgically addressed due to financial constraints.  He engages in physical activity by walking and plans to increase his exercise routine in the coming year. He follows a low-salt diet and avoids eating out to manage his blood pressure and overall health. He has not yet received his flu shot.    Assessment & Plan     Past Medical  History:  Diagnosis Date   Blindness of left eye    Diabetes mellitus without complication (HCC)    Glaucoma of both eyes    Hyperlipidemia    Tobacco dependence     Past Surgical History:  Procedure Laterality Date   EYE SURGERY     SHOULDER ARTHROSCOPY WITH ROTATOR CUFF REPAIR     right shoulder 09/2017     Family History  Problem Relation Age of Onset   Diabetes Mother    Breast cancer Mother    Heart disease Father    Prostate cancer Paternal Uncle    Diabetes Maternal Grandmother    Prostate cancer Paternal Grandfather    Colon cancer Neg Hx     Social History   Socioeconomic History   Marital status: Single    Spouse name: Not on file   Number of children: Not on file   Years of education: Not on file   Highest education level: 9th grade  Occupational History   Not on file  Tobacco Use   Smoking status: Former    Current packs/day: 0.00    Types: Cigarettes    Quit date: 08/2022    Years since quitting: 1.9   Smokeless tobacco: Never  Vaping Use   Vaping status: Some Days  Substance and Sexual Activity   Alcohol  use: Yes    Comment: occ   Drug use: Not Currently    Types: Marijuana   Sexual activity: Yes  Other Topics Concern   Not  on file  Social History Narrative   Lives with his fiance   Social Drivers of Health   Financial Resource Strain: High Risk (07/20/2024)   Overall Financial Resource Strain (CARDIA)    Difficulty of Paying Living Expenses: Hard  Food Insecurity: Food Insecurity Present (07/20/2024)   Hunger Vital Sign    Worried About Running Out of Food in the Last Year: Sometimes true    Ran Out of Food in the Last Year: Often true  Transportation Needs: Unmet Transportation Needs (07/20/2024)   PRAPARE - Administrator, Civil Service (Medical): Yes    Lack of Transportation (Non-Medical): Yes  Physical Activity: Insufficiently Active (07/20/2024)   Exercise Vital Sign    Days of Exercise per Week: 7 days    Minutes  of Exercise per Session: 20 min  Stress: No Stress Concern Present (07/20/2024)   [employer] of Occupational Health - Occupational Stress Questionnaire    Feeling of Stress: Not at all  Social Connections: Moderately Integrated (07/20/2024)   Social Connection and Isolation Panel    Frequency of Communication with Friends and Family: Once a week    Frequency of Social Gatherings with Friends and Family: Once a week    Attends Religious Services: More than 4 times per year    Active Member of [employer] or Organizations: Yes    Attends Engineer, Structural: More than 4 times per year    Marital Status: Living with partner  Intimate Partner Violence: Not on file    Outpatient Medications Prior to Visit  Medication Sig Dispense Refill   atorvastatin  (LIPITOR) 40 MG tablet Take 1 tablet (40 mg total) by mouth daily. 90 tablet 3   empagliflozin  (JARDIANCE ) 25 MG TABS tablet Take 1 tablet (25 mg total) by mouth daily before breakfast. 90 tablet 3   latanoprost  (XALATAN ) 0.005 % ophthalmic solution Place 1 drop into the right eye at bedtime. 7.5 mL 5   metFORMIN  (GLUCOPHAGE -XR) 500 MG 24 hr tablet Take 2 tablets (1,000 mg total) by mouth 2 (two) times daily with a meal. 360 tablet 3   sildenafil  (VIAGRA ) 50 MG tablet Take 1 tablet (50 mg total) by mouth daily as needed for erectile dysfunction. 10 tablet 3   No facility-administered medications prior to visit.    Allergies  Allergen Reactions   Lisinopril  Other (See Comments)    Headache throughout day     ROS Review of Systems  Constitutional:  Negative for appetite change, chills, fatigue and fever.  HENT:  Negative for congestion, postnasal drip, rhinorrhea and sneezing.   Eyes:  Positive for visual disturbance.  Respiratory:  Negative for cough, shortness of breath and wheezing.   Cardiovascular:  Negative for chest pain, palpitations and leg swelling.  Gastrointestinal:  Negative for abdominal pain, constipation,  nausea and vomiting.  Genitourinary:  Negative for difficulty urinating, dysuria, flank pain and frequency.  Musculoskeletal:  Negative for arthralgias, back pain, joint swelling and myalgias.  Skin:  Negative for color change, pallor, rash and wound.  Neurological:  Negative for dizziness, facial asymmetry, weakness, numbness and headaches.  Psychiatric/Behavioral:  Negative for behavioral problems, confusion, self-injury and suicidal ideas.       Objective:    Physical Exam Vitals and nursing note reviewed.  Constitutional:      General: He is not in acute distress.    Appearance: Normal appearance. He is not ill-appearing, toxic-appearing or diaphoretic.  Eyes:     Comments: Left eye blindness  Cardiovascular:  Rate and Rhythm: Normal rate and regular rhythm.     Pulses: Normal pulses.     Heart sounds: Normal heart sounds. No murmur heard.    No friction rub. No gallop.  Pulmonary:     Effort: Pulmonary effort is normal. No respiratory distress.     Breath sounds: Normal breath sounds. No stridor. No wheezing, rhonchi or rales.  Chest:     Chest wall: No tenderness.  Abdominal:     General: There is no distension.     Palpations: Abdomen is soft.     Tenderness: There is no abdominal tenderness. There is no right CVA tenderness, left CVA tenderness or guarding.  Musculoskeletal:        General: No swelling, tenderness, deformity or signs of injury.     Right lower leg: No edema.     Left lower leg: No edema.  Skin:    General: Skin is warm and dry.     Capillary Refill: Capillary refill takes less than 2 seconds.     Coloration: Skin is not jaundiced or pale.     Findings: No bruising, erythema or lesion.  Neurological:     Mental Status: He is alert and oriented to person, place, and time.     Motor: No weakness.     Gait: Gait normal.  Psychiatric:        Mood and Affect: Mood normal.        Behavior: Behavior normal.        Thought Content: Thought content  normal.        Judgment: Judgment normal.     BP (!) 140/81   Pulse 66   Wt 163 lb (73.9 kg)   SpO2 100%   BMI 25.53 kg/m  Wt Readings from Last 3 Encounters:  08/02/24 163 lb (73.9 kg)  03/20/24 156 lb (70.8 kg)  12/13/23 161 lb (73 kg)    Lab Results  Component Value Date   TSH 0.551 08/26/2017   Lab Results  Component Value Date   WBC 4.9 11/12/2023   HGB 12.2 (L) 11/12/2023   HCT 42.0 11/12/2023   MCV 75 (L) 11/12/2023   PLT 264 11/12/2023   Lab Results  Component Value Date   NA 139 03/20/2024   K 4.6 03/20/2024   CO2 19 (L) 03/20/2024   GLUCOSE 119 (H) 03/20/2024   BUN 12 03/20/2024   CREATININE 1.09 03/20/2024   BILITOT 0.7 11/12/2023   ALKPHOS 119 11/12/2023   AST 17 11/12/2023   ALT 31 11/12/2023   PROT 7.3 11/12/2023   ALBUMIN 4.5 11/12/2023   CALCIUM  9.5 03/20/2024   ANIONGAP 13 12/16/2017   EGFR 85 03/20/2024   Lab Results  Component Value Date   CHOL 88 (L) 11/12/2023   Lab Results  Component Value Date   HDL 35 (L) 11/12/2023   Lab Results  Component Value Date   LDLCALC 36 11/12/2023   Lab Results  Component Value Date   TRIG 85 11/12/2023   Lab Results  Component Value Date   CHOLHDL 2.5 11/12/2023   Lab Results  Component Value Date   HGBA1C 6.1 (A) 08/02/2024      Assessment & Plan:   Problem List Items Addressed This Visit       Cardiovascular and Mediastinum   Hypertension       08/02/2024   10:55 AM 08/02/2024   10:48 AM 08/02/2024   10:47 AM 03/20/2024    2:13 PM 03/20/2024  2:04 PM 12/13/2023    1:59 PM 11/12/2023    2:08 PM  BP/Weight  Systolic BP 140 141 154 129 139 120 135  Diastolic BP 81 87 95 74 82 75 76  Wt. (Lbs)   163  156 161   BMI   25.53 kg/m2  24.43 kg/m2 25.22 kg/m2   Essential hypertension in a patient with type 2 diabetes mellitus Blood pressure elevated at 140/81 mmHg, home readings often >130/80 mmHg. Previous lisinopril  2.5 mg caused adverse effects. Target <130/80 mmHg to reduce  diabetes complications. - Encouraged home blood pressure monitoring. - Advised heart-healthy, low-salt, low-fat diet. - Encouraged regular exercise, 30-45 minutes daily. - Reassess blood pressure in 3 months.       Relevant Orders   Basic Metabolic Panel     Endocrine   Hyperlipidemia associated with type 2 diabetes mellitus (HCC) - Primary    A1c well-controlled at 6.1%. Postprandial blood sugars 177 mg/dL and 847 mg/dL. Goal A1c <6.5%. - Continue metformin  1000 mg twice daily and empagliflozin  25 mg daily. - Encouraged adherence to low carb diet and exercise regimen. Diabetic foot exam completed  Hyperlipidemia Managed with atorvastatin  40 mg daily.  - Continue atorvastatin  40 mg daily. - Ordered lipid panel to assess current cholesterol levels.       Relevant Orders   Lipid panel   POCT glycosylated hemoglobin (Hb A1C) (Completed)     Other   Primary angle closure glaucoma of both eyes, severe stage   Chronic angle-closure glaucoma, bilateral, severe stage Right eye has two tubes, left eye one tube. Eye pressure fluctuates. Using latanoprost  in right eye at bedtime. - Continue latanoprost  in the right eye at bedtime. - Encouraged follow-up with ophthalmology and requested office notes for chart update.          Erectile dysfunction   Erectile dysfunction Managed with sildenafil  50 mg as needed. - Continue sildenafil  50 mg as needed.      Need for influenza vaccination   Relevant Orders   Flu vaccine trivalent PF, 6mos and older(Flulaval,Afluria,Fluarix,Fluzone) (Completed)    No orders of the defined types were placed in this encounter.   Follow-up: Return in about 3 months (around 11/02/2024) for HTN, DM.    Duane Earnshaw R Dimple Bastyr, FNP

## 2024-08-02 NOTE — Assessment & Plan Note (Addendum)
  A1c well-controlled at 6.1%. Postprandial blood sugars 177 mg/dL and 847 mg/dL. Goal A1c <6.5%. - Continue metformin  1000 mg twice daily and empagliflozin  25 mg daily. - Encouraged adherence to low carb diet and exercise regimen. Diabetic foot exam completed  Hyperlipidemia Managed with atorvastatin  40 mg daily.  - Continue atorvastatin  40 mg daily. - Ordered lipid panel to assess current cholesterol levels.

## 2024-08-02 NOTE — Assessment & Plan Note (Signed)
     08/02/2024   10:55 AM 08/02/2024   10:48 AM 08/02/2024   10:47 AM 03/20/2024    2:13 PM 03/20/2024    2:04 PM 12/13/2023    1:59 PM 11/12/2023    2:08 PM  BP/Weight  Systolic BP 140 141 154 129 139 120 135  Diastolic BP 81 87 95 74 82 75 76  Wt. (Lbs)   163  156 161   BMI   25.53 kg/m2  24.43 kg/m2 25.22 kg/m2   Essential hypertension in a patient with type 2 diabetes mellitus Blood pressure elevated at 140/81 mmHg, home readings often >130/80 mmHg. Previous lisinopril  2.5 mg caused adverse effects. Target <130/80 mmHg to reduce diabetes complications. - Encouraged home blood pressure monitoring. - Advised heart-healthy, low-salt, low-fat diet. - Encouraged regular exercise, 30-45 minutes daily. - Reassess blood pressure in 3 months.

## 2024-08-02 NOTE — Assessment & Plan Note (Signed)
 Erectile dysfunction Managed with sildenafil  50 mg as needed. - Continue sildenafil  50 mg as needed.

## 2024-08-03 LAB — BASIC METABOLIC PANEL WITH GFR
BUN/Creatinine Ratio: 13 (ref 9–20)
BUN: 13 mg/dL (ref 6–24)
CO2: 21 mmol/L (ref 20–29)
Calcium: 9.5 mg/dL (ref 8.7–10.2)
Chloride: 104 mmol/L (ref 96–106)
Creatinine, Ser: 1.03 mg/dL (ref 0.76–1.27)
Glucose: 104 mg/dL — ABNORMAL HIGH (ref 70–99)
Potassium: 4.7 mmol/L (ref 3.5–5.2)
Sodium: 138 mmol/L (ref 134–144)
eGFR: 90 mL/min/1.73 (ref >59)

## 2024-08-03 LAB — LIPID PANEL
Chol/HDL Ratio: 2.4 ratio (ref 0.0–5.0)
Cholesterol, Total: 87 mg/dL — ABNORMAL LOW (ref 100–199)
HDL: 36 mg/dL — ABNORMAL LOW (ref >39)
LDL Chol Calc (NIH): 38 mg/dL (ref 0–99)
Triglycerides: 52 mg/dL (ref 0–149)
VLDL Cholesterol Cal: 13 mg/dL (ref 5–40)

## 2024-08-07 ENCOUNTER — Ambulatory Visit: Payer: Self-pay | Admitting: Nurse Practitioner

## 2024-08-18 ENCOUNTER — Telehealth: Payer: Self-pay

## 2024-08-18 ENCOUNTER — Other Ambulatory Visit: Payer: Self-pay

## 2024-08-18 NOTE — Telephone Encounter (Signed)
 Attempted to contact patient for scheduled appointment for medication management. Left HIPAA compliant message for patient to return my call at their convenience.   Lorain Baseman, PharmD [facility] [phone]

## 2024-08-18 NOTE — Progress Notes (Deleted)
 08/18/2024 Name: Devon Pace MRN: [id number] DOB: 1975/09/24  No chief complaint on file.   Devon Pace is a 48 y.o. year old male who was referred for medication management by their primary care provider, [name], [name], FNP. They presented for a telephone visit today.   They were referred to the pharmacist by their PCP for assistance in managing medication adherence, diabetes.  PMH includes HTN, T2DM, HLD, glaucoma.   Subjective: Patient was engaged by Dorcas Solian, PharmD on 01/04/24 to discuss medication adherence. He was advised to start using a weekly pill box. He had self-discontinued lisinopril  due to headaches and was advised to discuss alternatives with his PCP. At last visit with PCP on 12/13/23, A1C had increased from 7.5% to 10.5%. Patient was engaged by pharmacy via telephone on 02/24/24. He reported that he had not been able to refill some of his medications due to cost. He was switched to [facility] pharmacy for mail order. He was instructed to increase Jardiance  to 25 mg daily for maximum glycemic control. At his PCP appt on 03/20/24, his A1C had improved from 10.5% to 6.3%. His BP was controlled at 129/74 mmHg without medication. At pharmacy telephone appt on 04/17/24, patient reported doing well with current supplies of his medications.   Today, patient reports doing well. He has current supplies of his medications. He said he has about 9 days left of his maintenance medications. He received the bill from the pharmacy in the mail for the previous round and was able to pay it. He would like to proceed with this method of receiving his medications again.  Care Team: Primary Care Provider: [name], [name], FNP ; Next Scheduled Visit: 07/21/24  Medication Access/Adherence  Current Pharmacy:  DARRYLE LONG - [facility] [address] [place] [address] Phone: [phone] Fax: [phone]   Patient reports affordability concerns with their medications:  Yes   - [facility] with [facility] account, as he cannot currently afford his medications. Aware of  [place] Medicaid law prohibiting withholding medication due to an inability to pay.  Patient reports access/transportation concerns to their pharmacy: Yes  - Using [facility] for mail order  Patient reports adherence concerns with their medications:  Yes  - hx of stopping meds due to cost. Maintenance meds last filled via mail order from [facility] on 02/25/24  Diabetes:  Current medications: metformin  XR 1000 mg BID, Jardiance  25 mg daily Medications tried in the past: insulin  glargine (2019), Januvia , Janumet   He does report more thirst since increasing Jardiance  to 25 mg daily - but this is manageable and he is staying hydrated with water  Current glucose readings: - reports that he hast not checked BG in a couple weeks. Most recent FBG he recalls was 120 mg/dL about a week ago.  Using glucometer; previously testing 1-2 times per week  Patient denies hypoglycemic s/sx including dizziness, shakiness, sweating. Patient denies hyperglycemic symptoms including polyuria, polydipsia, polyphagia, nocturia (usually once per night), neuropathy, blurred vision. Denies fatigue.  Current meal patterns: 2 meals per day. Staying away from fried food, pasta, rice, bread. Reports staying hydrated with water (6 bottles/day)  Current medication access support: Medicaid insurance  Hypertension:  Current medications: none Medications previously tried: lisinopril  - dizziness, HA, did not feel right  Hyperlipidemia/ASCVD Risk Reduction  Current lipid lowering medications: atorvastatin  40 mg daily   Objective:  BP Readings from Last 3 Encounters:  08/02/24 (!) 140/81  03/20/24 129/74  12/13/23  120/75    Lab Results  Component Value Date   HGBA1C 6.1 (A) 08/02/2024   HGBA1C 6.3 (A) 03/20/2024   HGBA1C 10.5 (A) 11/12/2023   UACR 12/13/23: 16 mg/g  Lab Results  Component Value Date    CREATININE 1.03 08/02/2024   BUN 13 08/02/2024   NA 138 08/02/2024   K 4.7 08/02/2024   CL 104 08/02/2024   CO2 21 08/02/2024    Lab Results  Component Value Date   CHOL 87 (L) 08/02/2024   HDL 36 (L) 08/02/2024   LDLCALC 38 08/02/2024   TRIG 52 08/02/2024   CHOLHDL 2.4 08/02/2024    Medications Reviewed Today   Medications were not reviewed in this encounter       Assessment/Plan:   Diabetes: - Currently controlled with last A1C of 6.3% below goal < 7%, and improved from 10.5% with improved medication adherence. Patient is tolerating current regimen well. Will continue to follow for adherence concerns.  - Reviewed long term cardiovascular and renal outcomes of uncontrolled blood sugar - Reviewed goal A1c, goal fasting, and goal 2 hour post prandial glucose - Reviewed dietary modifications including limiting intake of carbohydrate heavy foods, increasing intake of non-starchy vegetables, and increasing intake of protein. Recommended to stay hydrated throughout the day with water. - Recommend to continue metformin  XR 500 mg BID - Recommend to continue Jardiance  to 25 mg daily  - Recommend to check glucose once daily fasting - Patient is appropriately treated with a high-intensity statin. Recommend to continue atorvastatin  40 mg daily.  - Next A1C due October 2025   Hypertension: - Currently controlled with last clinic BP below goal < 130/80 mmHg. BP has been at goal without medication. He does not have a compelling indication for an ACEi. ng low-dose amlodipine. - Reviewed long term cardiovascular and renal outcomes of uncontrolled blood pressure   Hyperlipidemia/ASCVD Risk Reduction: - Currently controlled with most recent LDL-C of 36 mg/dL below goal < 70 mg/dL given U7IF + comorbidities. High intensity statin indicated. - Recommend to continue atorvastatin  40 mg daily    Requested refill of maintenance medications via [facility] mail order and [facility] account today. Provided  patient name of dentist that takes Medicaid in [place] as requested.   Follow Up Plan: PCP 07/21/24, PharmD telephone 08/18/24   Lorain Baseman, PharmD [facility] [phone]

## 2024-09-15 ENCOUNTER — Telehealth: Payer: Self-pay

## 2024-09-15 NOTE — Progress Notes (Signed)
 Complex Care Management Care Guide Note  09/15/2024 Name: Devon Pace MRN: [id number] DOB: 09/03/1976  Devon Pace is a 49 y.o. year old male who is a primary care patient of [name], [name], FNP and is actively engaged with the care management team. I reached out to Devon Pace by phone today to assist with re-scheduling  with the Pharmacist.  Follow up plan: Telephone appointment with complex care management team member scheduled for:  09/25/24 @ 10:30  Leotis Rase [facility], [facility] Guide  Direct Dial: [phone]  Fax [phone]

## 2024-09-25 ENCOUNTER — Other Ambulatory Visit: Payer: Self-pay

## 2024-09-25 ENCOUNTER — Telehealth: Payer: Self-pay

## 2024-09-25 NOTE — Progress Notes (Unsigned)
 "  09/25/2024 Name: Devon Pace MRN: [id number] DOB: 01-24-1976  No chief complaint on file.   Devon Pace is a 49 y.o. year old male who was referred for medication management by their primary care provider, [name], [name], FNP. They presented for a telephone visit today.   They were referred to the pharmacist by their PCP for assistance in managing medication adherence, diabetes.  PMH includes HTN, T2DM, HLD, glaucoma.   Subjective: Patient was engaged by Dorcas Solian, PharmD on 01/04/24 to discuss medication adherence. He was advised to start using a weekly pill box. He had self-discontinued lisinopril  due to headaches and was advised to discuss alternatives with his PCP. At last visit with PCP on 12/13/23, A1C had increased from 7.5% to 10.5%. Patient was engaged by pharmacy via telephone on 02/24/24. He reported that he had not been able to refill some of his medications due to cost. He was switched to [facility] pharmacy for mail order. He was instructed to increase Jardiance  to 25 mg daily for maximum glycemic control. At his PCP appt on 03/20/24, his A1C had improved from 10.5% to 6.3%. His BP was controlled at 129/74 mmHg without medication. At pharmacy telephone appt on 04/17/24, patient reported doing well with current supplies of his medications.   Today, patient reports doing well. He has current supplies of his medications. He said he has about 9 days left of his maintenance medications. He received the bill from the pharmacy in the mail for the previous round and was able to pay it. He would like to proceed with this method of receiving his medications again.  Care Team: Primary Care Provider: [name], [name], FNP ; Next Scheduled Visit: 07/21/24  Medication Access/Adherence  Current Pharmacy:  DARRYLE LONG - [facility] [address] [place] [address] Phone: [phone] Fax: [phone]   Patient reports affordability concerns with their medications:  Yes   - [facility] with AR account, as he cannot currently afford his medications. Aware of  [place] Medicaid law prohibiting withholding medication due to an inability to pay.  Patient reports access/transportation concerns to their pharmacy: Yes  - Using [facility] for mail order  Patient reports adherence concerns with their medications:  Yes  - hx of stopping meds due to cost. Maintenance meds last filled via mail order from [facility] on 02/25/24  Diabetes:  Current medications: metformin  XR 1000 mg BID, Jardiance  25 mg daily Medications tried in the past: insulin  glargine (2019), Januvia , Janumet   He does report more thirst since increasing Jardiance  to 25 mg daily - but this is manageable and he is staying hydrated with water  Current glucose readings: - reports that he hast not checked BG in a couple weeks. Most recent FBG he recalls was 120 mg/dL about a week ago.  Using glucometer; previously testing 1-2 times per week  Patient denies hypoglycemic s/sx including dizziness, shakiness, sweating. Patient denies hyperglycemic symptoms including polyuria, polydipsia, polyphagia, nocturia (usually once per night), neuropathy, blurred vision. Denies fatigue.  Current meal patterns: 2 meals per day. Staying away from fried food, pasta, rice, bread. Reports staying hydrated with water (6 bottles/day)  Current medication access support: Medicaid insurance  Hypertension:  Current medications: none Medications previously tried: lisinopril  - dizziness, HA, did not feel right  Hyperlipidemia/ASCVD Risk Reduction  Current lipid lowering medications: atorvastatin  40 mg daily   Objective:  BP Readings from Last 3 Encounters:  08/02/24 (!) 140/81  03/20/24 129/74  12/13/23 120/75    Lab Results  Component Value Date   HGBA1C 6.1 (A) 08/02/2024   HGBA1C 6.3 (A) 03/20/2024   HGBA1C 10.5 (A) 11/12/2023   UACR 12/13/23: 16 mg/g  Lab Results  Component Value Date    CREATININE 1.03 08/02/2024   BUN 13 08/02/2024   NA 138 08/02/2024   K 4.7 08/02/2024   CL 104 08/02/2024   CO2 21 08/02/2024    Lab Results  Component Value Date   CHOL 87 (L) 08/02/2024   HDL 36 (L) 08/02/2024   LDLCALC 38 08/02/2024   TRIG 52 08/02/2024   CHOLHDL 2.4 08/02/2024    Medications Reviewed Today   Medications were not reviewed in this encounter       Assessment/Plan:   Diabetes: - Currently controlled with last A1C of 6.3% below goal < 7%, and improved from 10.5% with improved medication adherence. Patient is tolerating current regimen well. Will continue to follow for adherence concerns.  - Reviewed long term cardiovascular and renal outcomes of uncontrolled blood sugar - Reviewed goal A1c, goal fasting, and goal 2 hour post prandial glucose - Reviewed dietary modifications including limiting intake of carbohydrate heavy foods, increasing intake of non-starchy vegetables, and increasing intake of protein. Recommended to stay hydrated throughout the day with water. - Recommend to continue metformin  XR 500 mg BID - Recommend to continue Jardiance  to 25 mg daily  - Recommend to check glucose once daily fasting - Patient is appropriately treated with a high-intensity statin. Recommend to continue atorvastatin  40 mg daily.  - Next A1C due October 2025   Hypertension: - Currently controlled with last clinic BP below goal < 130/80 mmHg. BP has been at goal without medication. He does not have a compelling indication for an ACEi. ng low-dose amlodipine. - Reviewed long term cardiovascular and renal outcomes of uncontrolled blood pressure   Hyperlipidemia/ASCVD Risk Reduction: - Currently controlled with most recent LDL-C of 36 mg/dL below goal < 70 mg/dL given U7IF + comorbidities. High intensity statin indicated. - Recommend to continue atorvastatin  40 mg daily    Requested refill of maintenance medications via [facility] mail order and AR account today. Provided  patient name of dentist that takes Medicaid in [place] as requested.   Follow Up Plan: PCP 07/21/24, PharmD telephone 08/18/24   Lorain Baseman, PharmD [facility] [phone]  "

## 2024-09-25 NOTE — Progress Notes (Signed)
 Attempted to contact patient for scheduled appointment for medication management. Left HIPAA compliant message for patient to return my call at their convenience.   Lorain Baseman, PharmD [facility] [phone]

## 2024-09-26 ENCOUNTER — Other Ambulatory Visit (HOSPITAL_COMMUNITY): Payer: Self-pay

## 2024-09-26 ENCOUNTER — Other Ambulatory Visit: Payer: Self-pay

## 2024-09-26 DIAGNOSIS — E1169 Type 2 diabetes mellitus with other specified complication: Secondary | ICD-10-CM

## 2024-09-26 NOTE — Progress Notes (Signed)
 "  09/26/2024 Name: Devon Pace MRN: [id number] DOB: 1975-11-29  Chief Complaint  Patient presents with   Diabetes   Hyperlipidemia    Devon Pace is a 49 y.o. year old male who was referred for medication management by their primary care provider, [name], [name], FNP. They presented for a telephone visit today.   They were referred to the pharmacist by their PCP for assistance in managing medication adherence, diabetes.  PMH includes HTN, T2DM, HLD, glaucoma.   Subjective: Patient was last seen by PCP, [name], NP on 08/02/24. BP was 140/81 mmHg, which is higher than usual for him. A1C remained controlled at 6.1%.   Today, patient reports doing ok. States he did run out of atorvastatin  and Jardiance  and has not requested refill from pharmacy because he didn't want to put more cost on his charge account. He has some sildenafil  and metformin  left.   Care Team: Primary Care Provider: [name], [name], FNP ; Next Scheduled Visit: 11/03/24  Medication Access/Adherence  Current Pharmacy:  DARRYLE LONG - [facility] [address] [place] [address] Phone: [phone] Fax: [phone]   Patient reports affordability concerns with their medications: Yes   - [facility] with [facility] account, as he cannot currently afford his medications. Aware of  [place] Medicaid law prohibiting withholding medication due to an inability to pay.  Patient reports access/transportation concerns to their pharmacy: Yes  - Using [facility] for mail order  Patient reports adherence concerns with their medications:  Yes  - hx of stopping meds due to cost. Maintenance meds last filled via mail order from [facility] on 05/29/24  Diabetes:  Current medications: metformin  XR 1000 mg BID, Jardiance  25 mg daily (currently out) Medications tried in the past: insulin  glargine (2019), Januvia , Janumet   Current glucose readings: -  yesterday 109 mmHg, checking FBG  occasionally  Patient denies hypoglycemic s/sx including dizziness, shakiness, sweating. Patient denies hyperglycemic symptoms including polyuria, polydipsia, polyphagia, nocturia (usually once per night), neuropathy, blurred vision. Denies fatigue.  Current meal patterns: 2 meals per day. Staying away from fried food, pasta, rice, bread. Reports staying hydrated with water (6 bottles/day)  Current medication access support: Medicaid insurance  Hypertension:  Current medications: none Medications previously tried: lisinopril  - dizziness, HA, did not feel right  Patient reports he has a BP cuff at home Not checking recently - willing to start keeping log  Hyperlipidemia/ASCVD Risk Reduction  Current lipid lowering medications: atorvastatin  40 mg daily   Objective:  BP Readings from Last 3 Encounters:  08/02/24 (!) 140/81  03/20/24 129/74  12/13/23 120/75    Lab Results  Component Value Date   HGBA1C 6.1 (A) 08/02/2024   HGBA1C 6.3 (A) 03/20/2024   HGBA1C 10.5 (A) 11/12/2023   UACR 12/13/23: 16 mg/g  Lab Results  Component Value Date   CREATININE 1.03 08/02/2024   BUN 13 08/02/2024   NA 138 08/02/2024   K 4.7 08/02/2024   CL 104 08/02/2024   CO2 21 08/02/2024    Lab Results  Component Value Date   CHOL 87 (L) 08/02/2024   HDL 36 (L) 08/02/2024   LDLCALC 38 08/02/2024   TRIG 52 08/02/2024   CHOLHDL 2.4 08/02/2024    Medications Reviewed Today     Reviewed by [name] SQUIBB, RPH-CPP (Pharmacist) on 09/26/24 at 1120  Med List Status: <None>   Medication Order Taking? Sig Documenting Provider Last Dose Status Informant  atorvastatin  (LIPITOR) 40 MG  tablet [id number]  Take 1 tablet (40 mg total) by mouth daily.  Patient not taking: Reported on 09/26/2024   [name], [name], FNP  Active   empagliflozin  (JARDIANCE ) 25 MG TABS tablet [id number]  Take 1 tablet (25 mg total) by mouth daily before breakfast.  Patient not taking: Reported on 09/26/2024   [name],  [name], FNP  Active   latanoprost  (XALATAN ) 0.005 % ophthalmic solution [id number]  Place 1 drop into the right eye at bedtime.   Active   metFORMIN  (GLUCOPHAGE -XR) 500 MG 24 hr tablet [id number] Yes Take 2 tablets (1,000 mg total) by mouth 2 (two) times daily with a meal. [name], [name], FNP  Active   sildenafil  (VIAGRA ) 50 MG tablet [id number] Yes Take 1 tablet (50 mg total) by mouth daily as needed for erectile dysfunction. [name], [name], FNP  Active               Assessment/Plan:   Diabetes: - Currently controlled with last A1C of 6.1% below goal < 7%, and improved from 10.5% with improved medication adherence. However, cost is prohibiting adherence again. He has Medicaid and was again encouraged to continue using charge account with [facility]. - Reviewed long term cardiovascular and renal outcomes of uncontrolled blood sugar - Reviewed goal A1c, goal fasting, and goal 2 hour post prandial glucose - Reviewed dietary modifications including limiting intake of carbohydrate heavy foods, increasing intake of non-starchy vegetables, and increasing intake of protein. Recommended to stay hydrated throughout the day with water. - Recommend to continue metformin  XR 500 mg BID - Recommend to continue Jardiance  to 25 mg daily  - Recommend to check glucose once daily fasting - Patient is appropriately treated with a high-intensity statin. Recommend to continue atorvastatin  40 mg daily.  - Next A1C due Feb 2026   Hypertension: - Currently controlled with clinic BP usually below goal < 130/80 mmHg. Recently was elevated, but would prefer patient to check at home prior to starting medication.  He does not have a compelling indication for an ACEi.  - Reviewed long term cardiovascular and renal outcomes of uncontrolled blood pressure - Patient instructed to keep BP log and we will discuss values at follow-up appt.    Hyperlipidemia/ASCVD Risk Reduction: - Currently controlled with  most recent LDL-C of 36 mg/dL below goal < 70 mg/dL given U7IF + comorbidities. High intensity statin indicated. - Recommend to continue atorvastatin  40 mg daily    Requested refill of maintenance medications via [facility] mail order and AR account today.    Follow Up Plan: PCP 11/03/24, PharmD telephone 10/25/24   Lorain Baseman, PharmD [facility] [phone]  "

## 2024-10-25 ENCOUNTER — Other Ambulatory Visit: Payer: Self-pay

## 2024-11-03 ENCOUNTER — Ambulatory Visit: Payer: Self-pay | Admitting: Nurse Practitioner
